# Patient Record
Sex: Female | Born: 1950 | Race: White | Hispanic: No | Marital: Married
Health system: Midwestern US, Community
[De-identification: ages and names within clinical notes are randomized; demographics above are authoritative.]

## PROBLEM LIST (undated history)

## (undated) DIAGNOSIS — R350 Frequency of micturition: Secondary | ICD-10-CM

## (undated) DIAGNOSIS — R9431 Abnormal electrocardiogram [ECG] [EKG]: Secondary | ICD-10-CM

## (undated) DIAGNOSIS — Z1239 Encounter for other screening for malignant neoplasm of breast: Secondary | ICD-10-CM

## (undated) DIAGNOSIS — N390 Urinary tract infection, site not specified: Secondary | ICD-10-CM

## (undated) DIAGNOSIS — Z96649 Presence of unspecified artificial hip joint: Secondary | ICD-10-CM

---

## 2009-07-20 ENCOUNTER — Encounter

## 2009-07-23 LAB — CULTURE, URINE

## 2009-07-23 NOTE — Telephone Encounter (Signed)
We only have a pending result as of today and awaiting suseptibility report

## 2009-07-23 NOTE — Telephone Encounter (Signed)
DROPPED OFF URINE SAMPLE ON MON 10/11 AND HAS NOT RECEIVED HER RESULTS YET AND/OR A RX// SHE STATES SHE HAS HAD THIS FOR 2 WKS AND IS IN MISERY// SHE STATES SHE DID NOT THINK WE DID DIP BECAUSE OF MEDS SHE IS ON// PLEASE CALL HER AT [phone]

## 2009-07-23 NOTE — Telephone Encounter (Signed)
Discussed with patient.

## 2009-07-23 NOTE — Telephone Encounter (Signed)
Call her in cipro500 mg po bid for 7 days

## 2009-07-30 NOTE — Telephone Encounter (Signed)
Message copied by Bartholomew Boards on Thu Jul 30, 2009 11:03 AM  ------       Message from: Kelly Splinter       Created: Mon Jul 27, 2009 11:03 AM         Did we rx and if so,   With what?

## 2009-07-30 NOTE — Telephone Encounter (Signed)
I called in macrobid.

## 2009-07-30 NOTE — Telephone Encounter (Signed)
She was treated with cipro feeling better but not gone

## 2009-08-17 NOTE — Progress Notes (Signed)
Subjective:      Patient ID: Rebecca Horn is a 58 y.o. female.    HPIHere for FU of her UTI. She has residual urinary incontinence and urgency. No dysuria noted,min frequency    Review of Systems   Constitutional: Negative for activity change, appetite change and fatigue.   Genitourinary: Positive for urgency. Negative for dysuria, frequency, hematuria and enuresis.   Musculoskeletal: Positive for arthralgias. Negative for myalgias, joint swelling and gait problem.       Objective:   Physical Exam   Nursing note and vitals reviewed.  Constitutional: She is oriented to person, place, and time. She appears well-developed and well-nourished. No distress.   HENT:   Head: Normocephalic and atraumatic.   Cardiovascular: Normal rate, regular rhythm and normal heart sounds.    Pulmonary/Chest: Effort normal and breath sounds normal.   Abdominal: Soft. Bowel sounds are normal. She exhibits no distension. No tenderness. She has no rebound.   Neurological: She is alert and oriented to person, place, and time.   Skin: She is not diaphoretic.       Assessment:      OA-Xrays ordered, mobic 15mg  po qd  Urinary incontinence-send culture and await. INB and cx neg-trial toviaz      Plan:      As above

## 2009-08-18 LAB — BASIC METABOLIC PANEL
BUN/Creatinine Ratio: 30 (calc) — ABNORMAL HIGH (ref 6–22)
BUN: 17 mg/dL (ref 7–25)
CO2: 27 mmol/L (ref 21–33)
Calcium: 8.9 mg/dL (ref 8.6–10.2)
Chloride: 102 mmol/L (ref 98–110)
Creatinine: 0.57 mg/dL — ABNORMAL LOW (ref 0.60–1.10)
Est, Glom Filt Rate: >60 mL/min/{1.73_m2} (ref >=60)
Glucose: 79 mg/dL (ref 65–99)
Potassium: 3.5 mmol/L (ref 3.5–5.3)
Sodium: 139 mmol/L (ref 135–146)
eGFR African American: >60 mL/min/{1.73_m2} (ref >=60)

## 2009-08-19 LAB — CULTURE, URINE

## 2009-10-26 NOTE — Telephone Encounter (Signed)
Ok verbally per Dr Benjaman Pott to refill

## 2010-03-12 NOTE — Telephone Encounter (Signed)
Pt states she has symptoms of uti// dr schroer on vacation and pt was asked if she wanted to give [place] urine sample for culture and dr Sherryll Burger could read results or she could wait and come in next week when kelly parsons (nurse practioner) here // pt stated she did not want to wait and would go to urgent care and have them send results to [place]

## 2011-02-01 NOTE — Telephone Encounter (Signed)
Bring her in Friday INB. Increase the meclizine to 1 po tid for 5 days then 1 po tid prn

## 2011-02-01 NOTE — Telephone Encounter (Signed)
Discussed with patient

## 2011-02-01 NOTE — Telephone Encounter (Signed)
dixxiness x 1wk went to urgent care was told was a viral and will go away but it  hasn't and is worse today they gave her 25mg  take 1/2 tab q 8hr

## 2011-12-26 NOTE — Telephone Encounter (Signed)
Ok per Dr Benjaman Pott

## 2011-12-27 NOTE — Telephone Encounter (Signed)
Over due for appointment

## 2011-12-27 NOTE — Telephone Encounter (Signed)
Ok per Dr Benjaman Pott

## 2012-01-24 NOTE — Telephone Encounter (Signed)
Ok verbally per Dr Benjaman Pott to refill

## 2012-02-01 NOTE — Progress Notes (Signed)
Subjective:      Patient ID: Rebecca Horn is a 61 y.o. female.    HPI She is here for FU of her HTN as well as knee pian bilaterally and her incontinence.     Review of Systems   Constitutional: Negative for activity change, appetite change, fatigue and unexpected weight change.   Genitourinary: Negative for dysuria, urgency, frequency, hematuria and difficulty urinating.        Incontinence. Leaks if she doesn't get directly to the BR. She wears a pad every day   Musculoskeletal: Positive for arthralgias and gait problem.       Objective:   Physical Exam   Vitals reviewed.  Constitutional: She appears well-developed and well-nourished. No distress.   HENT:   Head: Normocephalic and atraumatic.   Cardiovascular: Normal rate, regular rhythm and normal heart sounds.    Pulmonary/Chest: Effort normal and breath sounds normal. No respiratory distress. She has no wheezes. She exhibits no tenderness.   Musculoskeletal:        Right knee: She exhibits normal range of motion, no effusion, no erythema and no bony tenderness.        Left knee: She exhibits normal range of motion, no effusion, no laceration, no bony tenderness and normal meniscus.        Right ankle: She exhibits decreased range of motion and swelling. She exhibits no ecchymosis. No tenderness. No lateral malleolus and no medial malleolus tenderness found. Achilles tendon normal.   Skin: She is not diaphoretic.       Assessment:      1. Hypertension     2. Osteopenia  DEXA Bone Density Axial Skeleton   3. Family history of osteoporosis     4. Breast cancer screening  MAM Digital Screen Unilateral   5. Adverse effects of medication  Basic Metabolic Panel   6. Colon cancer screening  Ambulatory referral to Gastroenterology   7. Knee pain, bilateral  X-ray knee left AP and lateral, X-ray knee right AP and lateral   8. Left ankle pain  X-ray ankle left AP lateral and oblique   9. urinary incontinence-toviaz trial         Plan:      Orders Placed This  Encounter   Procedures   . DEXA Bone Density Axial Skeleton     Standing Status: Future      Number of Occurrences:       Standing Expiration Date: 01/31/2013   . MAM Digital Screen Unilateral   . X-ray knee left AP and lateral     Order Specific Question:  Reason for exam:     Answer:  knee pain   . X-ray knee right AP and lateral     Order Specific Question:  Reason for exam:     Answer:  knee pain   . X-ray ankle left AP lateral and oblique     Order Specific Question:  Reason for exam:     Answer:  left ankle pain   . Basic Metabolic Panel   . Ambulatory referral to Gastroenterology     Referral Priority:  Routine     Referral Type:  Consult for Advice and Opinion     Requested Specialty:  Gastroenterology     Number of Visits Requested:  1

## 2012-02-01 NOTE — Patient Instructions (Addendum)
Patient Self-Management Goal for Chronic Condition  Goal: I will schedule the recommended follow up visit when leaving the office today, and agree to keep the appointment or to reschedule when I call to cancel.  Barriers to success: none  Plan for overcoming my barriers: N/A     Confidence: 9/10  Date goal set: 02/01/2012  Date goal attained:     Knee Pain: After Your Visit  Your Care Instructions  Overuse is often the cause of knee pain. Other causes are climbing stairs, kneeling, or other activities that use the knee. Everyday wear and tear, especially as you get older, also can cause knee pain.  Rest, along with home treatment, often relieves pain and allows your knee to heal. If you have a serious knee injury, you may need tests and treatment.  Follow-up care is a key part of your treatment and safety. Be sure to make and go to all appointments, and call your doctor if you are having problems. It's also a good idea to know your test results and keep a list of the medicines you take.  How can you care for yourself at home?   Take pain medicines exactly as directed.   If the doctor gave you a prescription medicine for pain, take it as prescribed.   If you are not taking a prescription pain medicine, ask your doctor if you can take an over-the-counter medicine.   Do not take two or more pain medicines at the same time unless the doctor told you to. Many pain medicines contain acetaminophen, which is Tylenol. Too much acetaminophen (Tylenol) can be harmful.   Rest and protect your knee. Take a break from any activity that may cause pain.   Put ice or a cold pack on your knee for 10 to 20 minutes at a time. Put a thin cloth between the ice and your skin.   Prop up a sore knee on a pillow when you ice it or anytime you sit or lie down for the next 3 days. Try to keep it above the level of your heart. This will help reduce swelling.   If your doctor recommends an elastic bandage, sleeve, or other type of  support for your knee, wear it as directed.   If your knee is not swollen, you can put moist heat, a heating pad, or a warm cloth on your knee.   After several days of rest, you can begin gentle exercise of your knee.   Reach and stay at a healthy weight. Extra weight can strain the joints, especially the knees and hips, and make the pain worse. Losing even a few pounds may help.  When should you call for help?  Call 911 anytime you think you may need emergency care. For example, call if:   You have sudden chest pain and shortness of breath, or you cough up blood.  Call your doctor now or seek immediate medical care if:   You have severe or increasing pain.   Your leg or foot turns cold or changes color.   You cannot stand or put weight on your knee.   Your knee looks twisted or bent out of shape.   You cannot move your knee.   You have signs of infection, such as:   Increased pain, swelling, warmth, or redness.   Red streaks leading from the sore area.   Pus draining from a place on your knee.   A fever.   You have signs of  a blood clot in your leg, such as:   Pain in your calf, back of the knee, thigh, or groin.   Redness and swelling in your leg or groin.  Watch closely for changes in your health, and be sure to contact your doctor if:   Your knee feels numb or tingly.   You do not get better as expected.   You have any new symptoms, such as swelling.   You have bruises from a knee injury that last longer than 2 weeks.    Where can you learn more?    Go to [email or web address] and sign in to your MyChart account.    Enter K195 in the Search Health Information box to learn more about "Knee Pain: After Your Visit."    If you do not have an account, please click on the "Sign Up Now" link.     2006-2012 [employer]. Care instructions adapted under license by [facility]. This care instruction is for use with your licensed healthcare professional. If  you have questions about a medical condition or this instruction, always ask your healthcare professional. [employer], Inc[employer] disclaims any warranty or liability for your use of this information.  Content Version: 9.4.94723; Last Revised: November 13, 2009              Rebecca Horn received counseling on the following healthy behaviors: exercise    Patient given educational materials on Nutrition and Exercise    I have instructed Rebecca Horn to complete a self tracking handout on Weights and instructed them to bring it with them to her next appointment.     Discussed use, benefit, and side effects of prescribed medications.  Barriers to medication compliance addressed.  All patient questions answered.  Pt voiced understanding.

## 2012-02-02 LAB — TSH: TSH: 1.41 u[IU]/mL (ref 0.35–5.5)

## 2012-02-02 LAB — BASIC METABOLIC PANEL
BUN: 20 mg/dl — ABNORMAL HIGH (ref 7–18)
CO2: 26 mEq/L (ref 21–32)
Calcium: 9.3 mg/dl (ref 8.3–10.6)
Chloride: 102 mEq/L (ref 99–110)
Creatinine: 0.6 mg/dl (ref 0.6–1.1)
GFR Est, African/Amer: >60
GFR, Estimated: >60 (ref >60)
Glucose: 80 mg/dl (ref 70–99)
Potassium: 4 mEq/L (ref 3.5–5.1)
Sodium: 140 mEq/L (ref 136–145)

## 2012-02-02 LAB — VITAMIN B12: Vitamin B-12: 293 pg/ml (ref 211–911)
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
## 2012-03-28 NOTE — Patient Instructions (Signed)
Toenail Fungus: After Your Visit  Your Care Instructions  A toenail that is infected by a fungus usually turns white or yellow. As the fungus spreads, the nail turns a darker color and gets thicker, and its edges start to turn ragged and crumble. A bad infection can cause toe pain, and the nail may pull away from the toe.  Toenails that are exposed to moisture and warmth a lot are more likely to get infected by a fungus. This can happen from wearing sweaty shoes often and from walking barefoot on shower floors.  It is hard to treat toenail fungus, and the infection can return after it has cleared up. But medicines can sometimes get rid of toenail fungus for good. If the infection is very bad, or if it causes a lot of pain, you may need to have the nail removed.  Follow-up care is a key part of your treatment and safety. Be sure to make and go to all appointments, and call your doctor if you are having problems. It???s also a good idea to know your test results and keep a list of the medicines you take.  How can you care for yourself at home?  ?? Take your medicines exactly as prescribed. Call your doctor if you have any problems with your medicine. You will get more details on the specific medicines your doctor prescribes.  ?? If your doctor gave you a cream or liquid to put on your toenail, use it exactly as directed.  ?? Wash your feet often, and wash your hands after touching your feet.  ?? Keep your toenails clean and dry. Dry your feet completely after you bathe and before you put on shoes and socks.  ?? Keep your toenails trimmed.  ?? Change socks often. Wear dry socks that absorb moisture.  ?? Do not go barefoot in public places.  ?? Use a spray or powder that fights fungus on your feet and in your shoes.  ?? Do not pick at the skin around your nails.  ?? Do not use nail polish or fake nails on your toenails.  When should you call for help?  Call your doctor now or seek immediate medical care if:  ?? You have signs of  infection, such as:  ?? Increased pain, swelling, warmth, or redness.  ?? Red streaks leading from the site.  ?? Pus draining from the site.  ?? A fever.  ?? You have new or increased toe pain.  Watch closely for changes in your health, and be sure to contact your doctor if:  ?? You do not get better as expected.    Where can you learn more?    Go to [email or web address] and sign in to your MyChart account. Enter D202 in the Search Health Information box to learn more about ???Toenail Fungus: After Your Visit.???    If you do not have an account, please click on the ???Sign Up Now??? link.      ?? 2006-2013 [employer], [employer]. Care instructions adapted under license by [facility]. This care instruction is for use with your licensed healthcare professional. If you have questions about a medical condition or this instruction, always ask your healthcare professional. [employer] disclaims any warranty or liability for your use of this information.  Content Version: 9.7.130178; Last Revised: August 09, 2010

## 2012-03-29 LAB — HEPATIC FUNCTION PANEL
ALT: 16 U/L (ref 10–40)
ALT: 16 U/L (ref 10–40)
AST: 19 U/L (ref 15–37)
AST: 19 U/L (ref 15–37)
Albumin: 3.9 g/dL (ref 3.4–5.0)
Albumin: 3.9 g/dL (ref 3.4–5.0)
Alkaline Phosphatase: 54 U/L (ref 45–129)
Alkaline Phosphatase: 54 U/L (ref 45–129)
Bilirubin, Direct: 0.1 mg/dL (ref 0.0–0.3)
Bilirubin, Direct: 0.1 mg/dL (ref 0.0–0.3)
Bilirubin, Indirect: 0.3 mg/dL (ref 0.0–1.0)
I Bili-NB: 0.3 mg/dL (ref 0.0–1.0)
Total Bilirubin: 0.4 mg/dL (ref 0.0–1.0)
Total Bilirubin: 0.4 mg/dL (ref 0.0–1.0)
Total Protein: 6.1 g/dL — ABNORMAL LOW (ref 6.4–8.2)
Total Protein: 6.1 g/dL — ABNORMAL LOW (ref 6.4–8.2)

## 2012-04-24 MED ORDER — MICROZIDE 12.5 MG PO CAPS
12.5 MG | ORAL_CAPSULE | ORAL | Status: DC
Start: 2012-04-24 — End: 2012-06-23

## 2012-04-24 NOTE — Telephone Encounter (Signed)
Ok verbally per Dr Schroer to refill

## 2012-05-17 NOTE — Op Note (Unsigned)
PATIENT NAME:                 PA #:            MR #Rebecca Horn, Rebecca Horn              [id number]       [id number]            SURGEON:                              SURG DATE:  DIS DATE:          Augustina Mood, DO              05/17/2012                     DATE OF BIRTH:   AGE:           PATIENT TYPE:     RM #:              1951-03-16       60             OSQ                                     PROCEDURE:  Esophagogastroduodenoscopy with biopsy, esophageal balloon  dilatation, and colonoscopy with biopsy and polypectomy.     INDICATIONS:  This is a 61 year old Caucasian female presenting for initial  screening colonoscopy.  She is at average risk for developing colon cancer  with no family history of colon cancer or colon polyps.  She also reports  some dysphagia for solids and history of heartburn.     CONSENT:  The patient has signed a consent and is aware of the indications,  complications and alternatives to the procedure.  The patient was also  examined by myself before the procedure and was found to be fit for conscious  sedation.  The patient is aware of the sedation to be used.     MONITORING:  The patient was monitored with continuous pulse oximetry and  intermittent blood pressure monitoring throughout the procedure.  The patient  was hemodynamically stable throughout the procedure.  The patient was  comfortable throughout the procedure.     PREMEDICATIONS:  Demerol 125 mg, Versed 10 mg were given over a slow IV in  incremental boluses prior to and during the procedure.       POSTOPERATIVE DIAGNOSES:  1.  Mild [place] class A reflux esophagitis.  2.  A benign mild Schatzki ring noted at the gastroesophageal junction,  status post empiric dilation with a 20 mm controlled-release expansion  balloon.  3.  Small sliding hiatal hernia.  4.  Mild gastritis.  Biopsies were obtained with cold forceps for histology  to evaluate for the presence of Helicobacter pylori.  5.  Normal appearing  duodenal bulb and second portion of the duodenum.  6.  A 4 mm sessile ascending colon polyp removed with biopsy polypectomy.  7.  Mild left-sided diverticular disease.  8.  Two sessile rectal polyps measuring 2 mm respectively, removed with  biopsy polypectomy x2.  9.  Small internal hemorrhoids.     PROCEDURE DETAILS:  After adequate premedication, the patient was  placed in  the left lateral decubitus position.  The Olympus endoscope was introduced  through the oropharynx to the extent of the second portion of the duodenum.   The procedure was accomplished without difficulty and the patient tolerated  the procedure well.  Mucosal views were adequate.  The cervical, mid and  distal esophagus was grossly examined.  There was mild [place] class A reflux  esophagitis.  No columnar epithelium was visualized.  There was also a  Schatzki ring noted.  This was mild in severity.  The scope was easily  traversed through the gastroesophageal junction into the stomach.   Retroflexed views of the stomach did reveal a small sliding hiatal hernia.   The body and greater curvature of the stomach had evidence of retained  bilious liquid.  There was mild gastritis noted in the stomach.  Biopsies  were obtained with cold forceps from the body and antrum of the stomach.  No  ulcers were seen.  No retained food was noted.  The pylorus was easily  traversed into the duodenal bulb.  The duodenal bulb and second portion of  the duodenum were normal.  The instrument was then withdrawn back to the  gastroesophageal junction.  An empiric 20 mm balloon dilatation was  undertaken at the level of the gastroesophageal junction.  The balloon was  left inflated for 30 seconds.  The balloon was deflated.  Postdilatation  appearance satisfactory, without any evidence of excessive bleeding or any  evidence of mucosal tearing.  The instrument was then withdrawn and the  procedure was completed.       The patient was then repositioned and the Olympus  pediatric colonoscope was  introduced atraumatically through the anus to the extent of the cecum.  The  procedure was accomplished without difficulty and the patient tolerated the  procedure well.  Mucosal views were adequate.  The colon preparation was  good.  The appendiceal orifice and ileocecal valve were identified and  appeared grossly normal.  The scope was then slowly withdrawn with close  mucosal inspection.  The vascular pattern throughout the entire colon was  within normal limits.  There was a 4 mm proximal ascending colon polyp  identified.  This was removed with biopsy polypectomy.  The postpolypectomy  appearance was satisfactory, without any evidence of remnant tissue or  excessive bleeding.  There was some mild diverticular disease noted in the  left colon.  In the distal rectum just proximal to the dentate line there  were 2 sessile polyps measuring 2 mm, respectively.  These were removed with  biopsy polypectomy x2.  The postpolypectomy appearance was satisfactory  without any evidence of remnant tissue or excessive bleeding.  There were  small internal hemorrhoids noted on retroflex view of the rectum.  The  instrument was then withdrawn and the procedure was completed.     PLAN:  1.  We will start the patient on omeprazole 20 mg by mouth once daily.  2.  Antireflux precautions will be stressed to the patient.  3.  We will monitor the patients response to dilatation.  4.  Would follow up on the biopsies of the colon polyps removed today.  If  all 3 polyps reveal adenomatous tissue would recommend a repeat colonoscopy  in 3 years.  If only 2 or less polyps reveal adenomatous tissue would  recommend a repeat colonoscopy in 5 years.  If no polyps reveal adenomatous  tissue can repeat colonoscopy in 10 years.  5.  Would recommend a high-fiber diet to the patient.    6.  The patient will be instructed to call our office with any severe  postpolypectomy bleeding or postprocedural pain.                                             Durene Romans Bennett, [place]     [id number]  DD: 05/17/2012 08:51   DT: 05/17/2012 09:20   Job #: [id number]  CC: Mendel Ryder, DO  CC: Kelly Splinter, MD

## 2012-06-06 NOTE — Progress Notes (Signed)
Patient understands medication purpose, goal and dosage.

## 2012-06-06 NOTE — Patient Instructions (Addendum)
Patient Self-Management Goal for Chronic Condition  Goal: I will take all medications as prescribed by my doctor, and I will call the office if I am having any medication problems.  Barriers to success: none  Plan for overcoming my barriers: N/A     Confidence: 8/10  Date goal set: 06/06/2012  Date goal attained:   Menopause Diet: After Your Visit  Your Care Instructions  Healthy eating helps ease menopause symptoms. And it can reduce your risk for getting conditions such as osteoporosis and heart disease.  Follow-up care is a key part of your treatment and safety. Be sure to make and go to all appointments, and call your doctor if you are having problems. It's also a good idea to know your test results and keep a list of the medicines you take.  How can you care for yourself at home?  ?? Limit fats in your diet.  ?? Choose foods that have a lot of calcium. The recommended daily intake for adults ages 35 to 23 is 1,000 milligrams (mg). Adults over 50 need 1,200 mg a day. Take a calcium supplement if you don't get enough calcium in the foods you eat.  ?? Add vitamin D to your daily diet. It helps your body use calcium. The recommended daily intake of vitamin D is 600 international units (IU) a day for children and adults up to age 64. Adults age 68 and older need 800 IU a day. Take vitamin D supplements if you need to.  ?? Include good sources of fiber in your diet each day. These include whole grains, beans, fruits, and vegetables.  ?? Avoid simple sugars. This helps if you have mood swings, anxiety, or depression.  ?? Avoid caffeine, or cut back on it. Caffeine can cause sleep problems. It can also make you feel anxious. To relieve these symptoms, pay attention to how much caffeine you are getting in drinks and chocolate.  ?? Limit your intake of alcohol. Heavy drinking tends to make symptoms worse.    Where can you learn more?    Go to [email or web address] and sign in to your MyChart account. Enter  [phone] in the Search Health Information box to learn more about ???Menopause Diet: After Your Visit.???    If you do not have an account, please click on the ???Sign Up Now??? link.      ?? 2006-2013 [employer]. Care instructions adapted under license by [facility]. This care instruction is for use with your licensed healthcare professional. If you have questions about a medical condition or this instruction, always ask your healthcare professional. [employer], Inc[employer] disclaims any warranty or liability for your use of this information.  Content Version: 9.7.130178; Last Revised: November 07, 2011

## 2012-06-07 LAB — COMPREHENSIVE METABOLIC PANEL
ALT: 19 U/L (ref 10–40)
AST: 15 U/L (ref 15–37)
Albumin/Globulin Ratio: 1.9 (ref 1.1–2.2)
Albumin: 4.1 g/dL (ref 3.4–5.0)
Alkaline Phosphatase: 56 U/L (ref 45–129)
BUN: 21 mg/dL — ABNORMAL HIGH (ref 7–18)
CO2: 27 mEq/L (ref 21–32)
Calcium: 9.4 mg/dL (ref 8.3–10.6)
Chloride: 103 mEq/L (ref 99–110)
Creatinine: 0.6 mg/dL (ref 0.6–1.2)
GFR African American: >60 (ref >60)
GFR Non-African American: >60 (ref >60)
Globulin: 2 g/dL
Glucose: 81 mg/dL (ref 70–99)
Potassium: 4 mEq/L (ref 3.5–5.1)
Sodium: 138 mEq/L (ref 136–145)
Total Bilirubin: 0.4 mg/dL (ref 0.00–1.00)
Total Protein: 6.3 g/dL — ABNORMAL LOW (ref 6.4–8.2)

## 2012-06-25 MED ORDER — MICROZIDE 12.5 MG PO CAPS
12.5 MG | ORAL_CAPSULE | ORAL | Status: DC
Start: 2012-06-25 — End: 2012-09-16

## 2012-06-25 NOTE — Telephone Encounter (Signed)
Ok verbally per Dr Schroer to refill

## 2012-06-27 MED ORDER — LAMISIL 250 MG PO TABS
250 MG | ORAL_TABLET | ORAL | Status: DC
Start: 2012-06-27 — End: 2012-08-22

## 2012-06-27 NOTE — Telephone Encounter (Signed)
Ok verbally per Dr Schroer to refill

## 2012-08-10 NOTE — Telephone Encounter (Signed)
Pt scheduled appt for next Friday w/ kelly

## 2012-08-17 MED ORDER — PREDNISONE 5 MG PO TABS
5 MG | ORAL_TABLET | Freq: Every day | ORAL | Status: AC
Start: 2012-08-17 — End: 2012-08-25

## 2012-08-17 MED ORDER — HYDROCODONE-ACETAMINOPHEN 5-325 MG PO TABS
5-325 MG | ORAL_TABLET | Freq: Three times a day (TID) | ORAL | Status: AC | PRN
Start: 2012-08-17 — End: 2012-09-16

## 2012-08-17 NOTE — Progress Notes (Signed)
Subjective:      Patient ID: Rebecca Horn is a 61 y.o. female.    HPI  Patient is here with a complaint of right hip pain that radiates into her right anterior thigh to the foot for 3-4 weeks.  The pain is most notable first thing in the morning and when she is ambulatory.  She has tingling in the right medial ankle, but this is not new. She remembers no injury or trauma.  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 Anselm Jungling and Dr. Aleene Davidson), but is trying to put it off.  She is compensating for the pain in the left knee.  She has been using a cane.  Of note is that she also has a history of osteoporosis.  She is currently on a drug holiday from bisphosphonates.  Dr. Benjaman Pott is planning to start her on Forteo.    Review of Systems   Constitutional: Positive for activity change.        Morbid obesity   Respiratory: Negative.    Cardiovascular: Negative.         History of hypertension.   Gastrointestinal: Negative.    Endocrine:        Diabetes; osteoporosis   Musculoskeletal: Positive for arthralgias and gait problem.        See HPI   Neurological: Positive for numbness.        Right ankle numbness unchanged   Hematological: Negative.    Psychiatric/Behavioral: Negative.        Objective:   Physical Exam   Nursing note and vitals reviewed.  Constitutional: She is oriented to person, place, and time. She appears well-developed and well-nourished. No distress.   HENT:   Head: Normocephalic and atraumatic.   Eyes: Conjunctivae are normal. No scleral icterus.   Cardiovascular: Normal rate, regular rhythm and normal heart sounds.    Pulmonary/Chest: Effort normal and breath sounds normal. No respiratory distress. She has no wheezes. She has no rales.   Musculoskeletal: She exhibits tenderness.   Sensation is intact.  SLR is negative.  Patrick's test is positive for pain in the groin region.  There is tenderness in the groin.  Muscle strength is slightly  decreased at the right hip.  DTR's are intact and equal.   Neurological: She is alert and oriented to person, place, and time.   Skin: Skin is warm and dry. She is not diaphoretic.   Psychiatric: She has a normal mood and affect. Her behavior is normal.       Assessment:      1. Hip pain  X-ray hip right AP and lateral   2. Osteoporosis, postmenopausal       I am treating with a tapering course of prednisone.  Norco for pain.  Will refer to either Dr. Berneda Rose or Vassie Loll (for knee replacement) and have them evaluate hip if no improvement; although I think this is functional due to compensation from knee pain.  Will get hip xray.      Dr. Benjaman Pott will discuss Forteo on return office visit.      Plan:      Aryka was seen today for leg pain.    Diagnoses and associated orders for this visit:    Hip pain  - X-ray hip right AP and lateral    Osteoporosis, postmenopausal    Other Orders  - predniSONE (DELTASONE) 5 MG tablet; Take 1  tablet by mouth daily for 8 days. 8 tablets on day 1; 7 tablets on day 2; 6 tablets on day 3; 5 tablets on day 4  - HYDROcodone-acetaminophen (NORCO) 5-325 MG per tablet; Take 1 tablet by mouth every 8 hours as needed for Pain for 30 days.

## 2012-08-17 NOTE — Patient Instructions (Signed)
Patient Self-Management Goal for Chronic Condition  Goal: I will take all medications as prescribed by my doctor, and I will call the office if I am having any medication problems.  Barriers to success: none  Plan for overcoming my barriers: N/A     Confidence: 8/10  Date goal set: 08/17/2012  Date goal attained:

## 2012-08-20 NOTE — Telephone Encounter (Signed)
Pt called this pm and was informed her hip x-ray was negative for fracture, but she states she doesn't feel the steroids or the norco are helping at all// I know she just started on them Friday, but she is asking for any other suggestions for what the pain is? ---pt states she will call our office back at 4:00 to check response

## 2012-08-21 NOTE — Telephone Encounter (Signed)
I discussed this with Dr. Benjaman Pott on Friday.  I can send her to an orthopedist.  Did Jodie ever get in touch with Dr. Scarlette Calico office to see if he is doing hips?  I asked her to check with him on Friday, and if she couldn't get him on Friday, to call his office on Monday and ask.  That was Dr. Larey Brick first choice.  If not Dr. Berneda Rose, then Dr. Vassie Loll.  Thank you.

## 2012-08-21 NOTE — Telephone Encounter (Signed)
Spoke with Jodie.  The patient will be seeing Dr. Berneda Rose tomorrow.  Jodie will inform the patient of this when she calls in at 4 pm because the patient does not have a working number and can only call in to the office.

## 2012-08-21 NOTE — Telephone Encounter (Signed)
Tresa Endo called and discussed this with jodie this am

## 2012-08-21 NOTE — Telephone Encounter (Signed)
Please call me with the status of this.  Thank you.

## 2012-08-21 NOTE — Telephone Encounter (Signed)
Discussed with patient

## 2012-08-22 MED ORDER — TERBINAFINE HCL 250 MG PO TABS
250 MG | ORAL_TABLET | ORAL | Status: DC
Start: 2012-08-22 — End: 2012-09-16

## 2012-08-22 NOTE — Progress Notes (Signed)
JULEANA COOKE  [id number]  August 22, 2012    Chief Complaint   Patient presents with   ??? Hip Pain     right         History:   Ms. ZAREYA PARRIS is a  61 y.o. female who is here for evaluation of right hip symptoms. She states she has had right hip pain over several months which has increased over the last month. There was no history of injury. The pain is located in the lateral side and in the groin. The pain is described as moderate. She is on a Prednisone taper which is not helping. She has a long standing history of left knee pain with progressive deformity. She is on Norco for pain.    The patient's  past medical history, medications, allergies,  family history, social history, and review of systems have been reviewed, and dated and are recorded in the chart.      BP 146/78   Pulse 68   Ht 5\' 2"  (1.575 m)   Wt 230 lb (104.327 kg)   BMI 42.06 kg/m2    Physical: Ms. TANZA VRANICH appears well, she is in no apparent distress, she demonstrates appropriate mood & affect. She is alert and oriented to person, place and time. She has severe pain with internal rotation of the right hip. Range of motion of the right hip is : 30 degrees abduction, 30 degrees adduction, 35 degrees of external rotation and 10 degrees of internal rotation. Range of motion of the opposite hip is full. She is non tender laterally about the hips. Trendelenburg test is negative bilaterally. Pearlean Brownie test is negative bilaterally. She is non tender about the Sacroiliac joint bilaterally. Leg length discrepancy: none.   Examination of the skin reveals no rashes, ulcerations, or lesions, bilaterally in the lower extremities.  Sensation to both lower extremities is grossly intact.  Exam of both feet reveals pedal pulses intact and brisk cap refill.  Patient is able to dorsiflex and wiggle all toes.  Deep tendon reflexes of the lower extremities are normal and symmetric. She is non tender to palpation of the lumbar spine. She has a severe  valgus deformity of the left knee.    X-rays: 2 views of the right hip were obtained and demonstrate severe osteoarthritis.    Impression: right Hip Osteoarthritis    Plan: At this time, the patient will be scheduled for a right total hip arthroplasty. All risks including but not limited to blood loss, infection, persistent pain,stiffness, weakness,loosening,deep vein thrombosis,neurovascular injury, and the risks of anesthesia were discussed. The patient understands all risks and benefits of the procedure and agrees to proceed. The patient will see her primary care physician,Melissa Koleen Distance, MD, for medical clearance.  If the patient is on NSAIDS or blood thinners these medications will need to be discontinued one week prior to surgery. The patient will ultimately require left total knee as well.

## 2012-08-22 NOTE — Telephone Encounter (Signed)
Ok verbally per Dr Schroer to refill

## 2012-09-05 LAB — CBC WITH AUTO DIFFERENTIAL
Basophils %: 0.7 %
Basophils Absolute: 0.1 10*3/uL (ref 0.0–0.2)
Eosinophils %: 2.4 %
Eosinophils Absolute: 0.2 10*3/uL (ref 0.0–0.6)
Hematocrit: 40.5 % (ref 36.0–48.0)
Hemoglobin: 13.5 g/dL (ref 12.0–16.0)
Lymphocytes %: 22.6 %
Lymphocytes Absolute: 1.7 10*3/uL (ref 1.0–5.1)
MCH: 32 pg (ref 26.0–34.0)
MCHC: 33.4 g/dL (ref 31.0–36.0)
MCV: 95.8 fL (ref 80.0–100.0)
MPV: 9.4 fL (ref 5.0–10.5)
Monocytes %: 8.6 %
Monocytes Absolute: 0.7 10*3/uL (ref 0.0–1.3)
Neutrophils %: 65.7 %
Neutrophils Absolute: 5 10*3/uL (ref 1.7–7.7)
Platelets: 261 10*3/uL (ref 135–450)
RBC: 4.23 M/uL (ref 4.00–5.20)
RDW: 14.4 % (ref 12.4–15.4)
WBC: 7.7 10*3/uL (ref 4.0–11.0)

## 2012-09-17 MED ORDER — HYDROCHLOROTHIAZIDE 12.5 MG PO CAPS
12.5 MG | ORAL_CAPSULE | ORAL | Status: DC
Start: 2012-09-17 — End: 2012-10-18

## 2012-09-17 MED ORDER — TERBINAFINE HCL 250 MG PO TABS
250 MG | ORAL_TABLET | ORAL | Status: DC
Start: 2012-09-17 — End: 2013-01-08

## 2012-09-17 NOTE — Telephone Encounter (Signed)
Ok verbally per Dr Schroer to refill

## 2012-10-07 NOTE — Progress Notes (Signed)
Subjective:      Patient here for follow-up of elevated blood pressure.  She is not exercising and is not adherent to a low-salt diet.  Blood pressure is well controlled at home. Cardiac symptoms: none. Patient denies: chest pain, chest pressure/discomfort, exertional chest pressure/discomfort and palpitations. Cardiovascular risk factors: none. Use of agents associated with hypertension: none. History of target organ damage: none.She also needs FU on her thickened nails.    Patient's medications, allergies, past medical, surgical, social and family histories were reviewed and updated as appropriate.    Review of Systems  Pertinent items are noted in HPI.        Objective:      BP 126/84   Pulse 67   Ht 5\' 2"  (1.575 m)   Wt 236 lb (107.049 kg)   BMI 43.15 kg/m2   SpO2 97%  General appearance: alert, appears stated age, cooperative and no distress  Neck: no adenopathy, supple, symmetrical, trachea midline and thyroid not enlarged, symmetric, no tenderness/mass/nodules  Lungs: clear to auscultation bilaterally  Heart: regular rate and rhythm, S1, S2 normal, no murmur, click, rub or gallop  Extremities: extremities normal, atraumatic, no cyanosis or edema and toenails are thickened and yellow. Especially affected are her great toes bialterally  Pulses: 2+ and symmetric        Assessment:      Hypertension, normal blood pressure . Evidence of target organ damage: none.      1. Hypertension     2. Onychomycosis of toenail     3. Medication monitoring encounter  Comprehensive metabolic panel   4. Need for diphtheria-tetanus-pertussis (Tdap) vaccine, adult/adolescent  Tdap vaccine greater than or equal to 7yo IM       Plan:      Medication: as per orders.   Orders Placed This Encounter   Procedures   ??? Tdap vaccine greater than or equal to 7yo IM   ??? Comprehensive metabolic panel

## 2012-10-09 LAB — COMPREHENSIVE METABOLIC PANEL
ALT: 18 U/L (ref 10–40)
AST: 19 U/L (ref 15–37)
Albumin/Globulin Ratio: 2 (ref 1.1–2.2)
Albumin: 4.1 g/dL (ref 3.4–5.0)
Alkaline Phosphatase: 67 U/L (ref 40–129)
BUN: 20 mg/dL (ref 7–20)
CO2: 27 mmol/L (ref 21–32)
Calcium: 8.7 mg/dL (ref 8.3–10.6)
Chloride: 100 mmol/L (ref 99–110)
Creatinine: 0.6 mg/dL (ref 0.6–1.2)
GFR African American: >60 (ref >60)
GFR Non-African American: >60 (ref >60)
Globulin: 2.1 g/dL
Glucose: 83 mg/dL (ref 70–99)
Potassium: 3.8 mmol/L (ref 3.5–5.1)
Sodium: 141 mmol/L (ref 136–145)
Total Bilirubin: 0.2 mg/dL (ref 0.0–1.0)
Total Protein: 6.2 g/dL — ABNORMAL LOW (ref 6.4–8.2)

## 2012-10-09 LAB — CBC
Hematocrit: 40.4 % (ref 36.0–48.0)
Hemoglobin: 13.6 g/dL (ref 12.0–16.0)
MCH: 31.8 pg (ref 26.0–34.0)
MCHC: 33.7 g/dL (ref 31.0–36.0)
MCV: 94.3 fL (ref 80.0–100.0)
MPV: 9.1 fL (ref 5.0–10.5)
Platelets: 229 10*3/uL (ref 135–450)
RBC: 4.28 M/uL (ref 4.00–5.20)
RDW: 14.4 % (ref 12.4–15.4)
WBC: 7.4 10*3/uL (ref 4.0–11.0)

## 2012-10-09 LAB — PROTIME-INR
INR: 0.91 (ref 0.85–1.15)
Protime: 10.2 s (ref 10.0–12.8)

## 2012-10-09 LAB — APTT: aPTT: 31.4 s (ref 23.1–35.5)

## 2012-10-09 LAB — SEDIMENTATION RATE: Sed Rate: 17 mm/Hr (ref 0–30)

## 2012-10-09 MED ORDER — METOPROLOL SUCCINATE ER 25 MG PO TB24
25 MG | ORAL_TABLET | Freq: Every day | ORAL | Status: DC
Start: 2012-10-09 — End: 2012-10-09

## 2012-10-09 MED ORDER — METOPROLOL SUCCINATE ER 25 MG PO TB24
25 MG | ORAL_TABLET | Freq: Every day | ORAL | Status: DC
Start: 2012-10-09 — End: 2013-01-08

## 2012-10-09 NOTE — Patient Instructions (Signed)
Patient Self-Management Goal for Chronic Condition  Goal: I will take all medications as prescribed by my doctor, and I will call the office if I am having any medication problems.  Barriers to success: none  Plan for overcoming my barriers: N/A     Confidence: 8/10  Date goal set: 10/09/2012  Date goal attained:

## 2012-10-09 NOTE — Progress Notes (Addendum)
Subjective:      Patient ID: Rebecca Horn is a 61 y.o. female.    HPI In for preoperative exam for right hip surgery (oatal hip replacement).  Scheduled on 10/16/12 by Dr. Berneda Rose at [facility].  Has a c/o of shooting pain from right hip to knee 1-2 times a day.  Takes Tylenol for pain management.  Is using a cane.  Gait is steady with cane.  Has a history of sleep apnea uses a Bipap at home at night.  Is on Toviaz 8 mg daily but still reports urgency frequency.  Has c/o back pain in the lower back has issues with a 4-5 vertebrate.  Has a 10 pound weight lifting limit.  Has difficulty with standing after sitting for long periods due to stiffnes for arthritis.  Has joint swelling and some limited ROM to the right hip.  Hip is tender to the touch.      Review of Systems   Constitutional: Negative.    HENT: Negative.    Eyes: Negative.    Respiratory: Positive for apnea. Negative for cough, shortness of breath and wheezing.    Cardiovascular: Negative.    Gastrointestinal: Negative.    Endocrine: Negative.    Genitourinary: Positive for urgency and frequency. Negative for dysuria, hematuria, flank pain, decreased urine volume, vaginal bleeding, vaginal discharge, enuresis, difficulty urinating, vaginal pain, pelvic pain and dyspareunia.   Musculoskeletal: Positive for back pain, joint swelling, arthralgias and gait problem. Negative for myalgias.   Skin: Negative.    Allergic/Immunologic: Negative.    Neurological: Negative.    Psychiatric/Behavioral: Negative.        Objective:   Physical Exam   Vitals reviewed.  Constitutional: She is oriented to person, place, and time. She appears well-developed and well-nourished. No distress.   HENT:   Head: Normocephalic and atraumatic.   Right Ear: External ear normal.   Left Ear: External ear normal.   Mouth/Throat: Oropharynx is clear and moist. No oropharyngeal exudate.   Eyes: Conjunctivae are normal. Pupils are equal, round, and reactive to light. Right eye  exhibits no discharge. Left eye exhibits no discharge. No scleral icterus.   Neck: Normal range of motion. Neck supple. No tracheal deviation present. No thyromegaly present.   Cardiovascular: Normal rate, normal heart sounds and intact distal pulses.  Exam reveals no gallop.    No murmur heard.  Pulmonary/Chest: Effort normal and breath sounds normal. No respiratory distress. She has no wheezes.   Abdominal: Soft. Bowel sounds are normal. She exhibits no distension and no mass.   Musculoskeletal: She exhibits tenderness. She exhibits no edema.   Neurological: She is alert and oriented to person, place, and time.   Skin: Skin is warm and dry. She is not diaphoretic.   Psychiatric: She has a normal mood and affect. Her behavior is normal. Thought content normal.       Assessment:      Preoperative examination       Plan:      Proceed with surgery as scheduled.          Labs and EKG reviewed.

## 2012-10-11 LAB — MICROSCOPIC URINALYSIS: RBC, UA: NONE SEEN /HPF (ref 0–2)

## 2012-10-11 LAB — APTT: aPTT: 30 s (ref 23.1–35.5)

## 2012-10-11 LAB — URINALYSIS WITH REFLEX TO CULTURE
Bilirubin Urine: NEGATIVE mg/dL
Blood, Urine: NEGATIVE
Glucose, Ur: NEGATIVE mg/dL
Ketones, Urine: NEGATIVE mg/dL
Nitrite, Urine: POSITIVE — AB
Protein, UA: NEGATIVE mg/dL
Specific Gravity, UA: 1.01 (ref 1.005–1.030)
Urobilinogen, Urine: 0.2 E.U./dL (ref <2.0)
pH, UA: 7 (ref 5.0–8.0)

## 2012-10-11 LAB — CBC
Hematocrit: 40.3 % (ref 36.0–48.0)
Hemoglobin: 13.5 g/dL (ref 12.0–16.0)
MCH: 31.5 pg (ref 26.0–34.0)
MCHC: 33.5 g/dL (ref 31.0–36.0)
MCV: 93.9 fL (ref 80.0–100.0)
MPV: 9 fL (ref 5.0–10.5)
Platelets: 223 10*3/uL (ref 135–450)
RBC: 4.29 M/uL (ref 4.00–5.20)
RDW: 14.4 % (ref 12.4–15.4)
WBC: 7.7 10*3/uL (ref 4.0–11.0)

## 2012-10-11 LAB — BASIC METABOLIC PANEL
BUN: 18 mg/dL (ref 7–20)
CO2: 30 mmol/L (ref 21–32)
Calcium: 9.1 mg/dL (ref 8.3–10.6)
Chloride: 100 mmol/L (ref 99–110)
Creatinine: 0.5 mg/dL — ABNORMAL LOW (ref 0.6–1.2)
GFR African American: >60 (ref >60)
GFR Non-African American: >60 (ref >60)
Glucose: 88 mg/dL (ref 70–99)
Potassium: 3.8 mmol/L (ref 3.5–5.1)
Sodium: 140 mmol/L (ref 136–145)

## 2012-10-11 LAB — TYPE AND SCREEN
ABO/Rh: O POS
Antibody Screen: NEGATIVE

## 2012-10-11 LAB — PROTIME-INR
INR: 0.88 (ref 0.85–1.15)
Protime: 9.9 s — ABNORMAL LOW (ref 10.0–12.8)

## 2012-10-11 LAB — ALBUMIN: Albumin: 4.3 g/dL (ref 3.4–5.0)

## 2012-10-11 NOTE — Patient Instructions (Signed)
PRE-OP INSTRUCTIONS     ?? Do not eat or drink anything after 12:00 midnight prior to surgery.  This includes water, chewing gum, mints and ice chips.  You may brush your teeth and gargle the morning of surgery but DO  NOT SWALLOW THE WATER.    Take the following medications with a small sip of water on the morning of surgery: Hydrochlorothiazide, and omeprazole   Bring c-pap machine    ?? If you use an inhaler, please use it the morning of surgery and bring with you to hospital.    ?? You may be asked to stop blood thinners such as:  Coumadin, Plavix, Fragmin and lovenox.  Please check with your doctor before stopping these or any other medications.    ?? Aspirin, ibuprofen, advil and naproxen, any anti-inflammatory products should be stopped for a week prior to your surgery.  Stop diclofenac and omega three    ?? Do not smoke and do not drink any alcoholic beverages 24 hours prior to your surgery.    ?? Please do not wear any jewelry or body piercings on the day of surgery.    ?? Please wear something simple, loose fitting clothing to the hospital.  Do not wear any make-up(including eye make-up) or nail polish on your fingers and toes.    ?? As part of our patient safety program to minimize surgical infections, we ask you to do the following:                    1. Please notify your surgeon if you develop any illness between now and the day of your surgery.  This includes a cough, cold, fever, sore throat, nausea, vomiting, diarrhea, etc.  Also                     please notify your surgeon if you experience dizziness, shortness of breath or                       Blurred vision between now and the time of your surgery.                   2.  Please notify your surgeon of any open or redden areas that may                        look infected                     3.  DO NOT shave your operative site 96 hours(four days) prior to surgery.                        4.  Shower the week before surgery with an antibacterial soap,  such as                       dial, safeguard, etc.                         5.  Three(3) days prior to your surgery, cleanse the operative site with Hibiclens(anti-microbial soap).  This soap may dry your skin, please do not apply any oils or lotions     ?? Please bring your insurance card and picture ID day of surgery    ?? If you have a living will or durable power of  attorny. Please bring in a copy of your advanced directives.    ?? If you have dentures, they will be removed before going to the OR, we will provide you with a container.  If you wear contact lenses or glasses, they will be removes, please bring a case for them.    ?? Have you seen your family doctor for a pre-op history and physical.      ?? Surgery scheduler will call you 48 hours prior to your surgery to notify you of the time of your surgery and the time you will need to be at hospital...patients are asked to arrive 21/2 hours prior to surgery.    ??  Please call Pre-Admission testing if you have any further questions.                  [facility] Pre-Admission testing phone number:  [phone]      Thank You for choosing [facility]!!

## 2012-10-11 NOTE — Progress Notes (Signed)
Per JET calls interview>>>Pt plans to return to her one story home with her husband. Pt thinks surgeon made a referral to a home care agency and she will call my no.to leave that information for post op. Regarding Lovenox coverage: I verified with [employer] in [place](ph [phone]) that 40 mg x 7 days would cost the patient $35.00 co pay. Will inform pt post op.Should Carliyah not be able to return home immediately after acute care, she realizes inpt rehab here at [facility] would likely be denied due to insurance restrictions. She would reluctantly entertain skilled care at [facility] or [facility] needed. A precert would need to be obtained. Will follow post op.  >Peggy Seneca Gadbois R.N.  Case Manager  [phone]

## 2012-10-11 NOTE — Telephone Encounter (Signed)
When you can, please review 12/31 pre-op labs and EKG for clearance// her surgery is on Monday----THANKS!!!!

## 2012-10-12 NOTE — Telephone Encounter (Signed)
Pt cleared---info faxed to [facility] pre-admission testing @ fax # [phone]

## 2012-10-15 MED ORDER — CIPROFLOXACIN HCL 500 MG PO TABS
500 MG | ORAL_TABLET | Freq: Two times a day (BID) | ORAL | Status: DC
Start: 2012-10-15 — End: 2012-10-18

## 2012-10-15 NOTE — Telephone Encounter (Signed)
Patient needs antibiotic for surgery.  Called in Cipro for today

## 2012-10-16 ENCOUNTER — Inpatient Hospital Stay: Admit: 2012-10-16 | Source: Ambulatory Visit

## 2012-10-16 LAB — MICROSCOPIC URINALYSIS: RBC, UA: NONE SEEN /HPF (ref 0–2)

## 2012-10-16 LAB — URINALYSIS
Bilirubin Urine: NEGATIVE mg/dL
Blood, Urine: NEGATIVE
Glucose, Ur: NEGATIVE mg/dL
Ketones, Urine: NEGATIVE mg/dL
Nitrite, Urine: POSITIVE — AB
Protein, UA: NEGATIVE mg/dL
Specific Gravity, UA: 1.02 (ref 1.005–1.030)
Urobilinogen, Urine: 0.2 E.U./dL (ref <2.0)
pH, UA: 6 (ref 5.0–8.0)

## 2012-10-16 LAB — POCT GLUCOSE: POC Glucose: 166 mg/dl — ABNORMAL HIGH (ref 70–99)

## 2012-10-16 LAB — EKG 12-LEAD
Atrial Rate: 64 {beats}/min
P Axis: 10 degrees
P-R Interval: 162 ms
Q-T Interval: 420 ms
QRS Duration: 100 ms
QTc Calculation (Bazett): 433 ms
R Axis: 28 degrees
T Axis: 28 degrees
Ventricular Rate: 64 {beats}/min

## 2012-10-16 MED ORDER — OXYCODONE-ACETAMINOPHEN 5-325 MG PO TABS
5-325 | ORAL | Status: DC | PRN
Start: 2012-10-16 — End: 2012-10-18
  Administered 2012-10-17 – 2012-10-18 (×2): 1 via ORAL

## 2012-10-16 MED ORDER — OXYCODONE-ACETAMINOPHEN 5-325 MG PO TABS
5-325 | ORAL | Status: DC | PRN
Start: 2012-10-16 — End: 2012-10-16

## 2012-10-16 MED ORDER — MORPHINE SULFATE (PF) 2 MG/ML IV SOLN
2 | INTRAVENOUS | Status: DC | PRN
Start: 2012-10-16 — End: 2012-10-16

## 2012-10-16 MED ORDER — HYDROMORPHONE HCL PF 1 MG/ML IJ SOLN
1 | INTRAMUSCULAR | Status: DC | PRN
Start: 2012-10-16 — End: 2012-10-18

## 2012-10-16 MED ORDER — NORMAL SALINE FLUSH 0.9 % IV SOLN
0.9 % | Freq: Two times a day (BID) | INTRAVENOUS | Status: DC
Start: 2012-10-16 — End: 2012-10-16
  Administered 2012-10-16: 14:00:00 via INTRAVENOUS

## 2012-10-16 MED ORDER — FENTANYL CITRATE 0.05 MG/ML IJ SOLN
0.05 | INTRAMUSCULAR | Status: DC | PRN
Start: 2012-10-16 — End: 2012-10-16

## 2012-10-16 MED ORDER — NORMAL SALINE FLUSH 0.9 % IV SOLN
0.9 % | INTRAVENOUS | Status: DC | PRN
Start: 2012-10-16 — End: 2012-10-16

## 2012-10-16 MED ORDER — SODIUM CHLORIDE 0.9 % IV SOLN
0.9 % | INTRAVENOUS | Status: DC
Start: 2012-10-16 — End: 2012-10-16
  Administered 2012-10-16: 12:00:00 via INTRAVENOUS

## 2012-10-16 MED ORDER — ONDANSETRON HCL 4 MG/2ML IJ SOLN
4 | Freq: Once | INTRAMUSCULAR | Status: AC | PRN
Start: 2012-10-16 — End: 2012-10-16

## 2012-10-16 MED ORDER — FAMOTIDINE 10 MG/ML IV SOLN
10 MG/ML | Freq: Once | INTRAVENOUS | Status: AC
Start: 2012-10-16 — End: 2012-10-16
  Administered 2012-10-16: 12:00:00 via INTRAVENOUS

## 2012-10-16 MED ORDER — ACETAMINOPHEN 325 MG PO TABS
325 | ORAL | Status: DC | PRN
Start: 2012-10-16 — End: 2012-10-18

## 2012-10-16 MED ADMIN — ondansetron (ZOFRAN) injection 4 mg: INTRAVENOUS | @ 15:00:00 | NDC 00409475503

## 2012-10-16 MED ADMIN — terbinafine (LAMISIL) tablet 125 mg: ORAL | @ 22:00:00 | NDC 55111025090

## 2012-10-16 MED ADMIN — famotidine (PEPCID) 10 MG/ML injection: @ 12:00:00 | NDC 00069012102

## 2012-10-16 MED ADMIN — calcium-vitamin D (OSCAL-500) 500-200 MG-UNIT per tablet 1 tablet: ORAL | @ 21:00:00 | NDC 00182443910

## 2012-10-16 MED ADMIN — insulin lispro (HUMALOG) injection 0-6 Units: SUBCUTANEOUS | @ 23:00:00 | NDC 00002751017

## 2012-10-16 MED ADMIN — 0.9 % sodium chloride infusion: INTRAVENOUS | @ 21:00:00 | NDC 00409798309

## 2012-10-16 MED ADMIN — ciprofloxacin (CIPRO) IVPB 400 mg: INTRAVENOUS | @ 20:00:00 | NDC 00409477702

## 2012-10-16 MED ADMIN — clindamycin (CLEOCIN) 600 mg in dextrose 5 % 50 mL IVPB: INTRAVENOUS | @ 22:00:00 | NDC 00781328991

## 2012-10-16 MED ADMIN — clindamycin (CLEOCIN) 600 mg in dextrose 5 % 50 mL IVPB: INTRAVENOUS | @ 12:00:00 | NDC 00009337502

## 2012-10-16 MED ADMIN — oxyCODONE-acetaminophen (PERCOCET) 5-325 MG per tablet 2 tablet: 2 | ORAL | @ 21:00:00 | NDC 00406051223

## 2012-10-16 MED FILL — TERBINAFINE HCL 250 MG PO TABS: 250 MG | ORAL | Qty: 1

## 2012-10-16 MED FILL — METOPROLOL SUCCINATE ER 25 MG PO TB24: 25 MG | ORAL | Qty: 1

## 2012-10-16 MED FILL — NAROPIN 5 MG/ML IJ SOLN: 5 MG/ML | INTRAMUSCULAR | Qty: 90

## 2012-10-16 MED FILL — DEXAMETHASONE SODIUM PHOSPHATE 4 MG/ML IJ SOLN: 4 MG/ML | INTRAMUSCULAR | Qty: 5

## 2012-10-16 MED FILL — SODIUM CHLORIDE 0.9 % IV SOLN: 0.9 % | INTRAVENOUS | Qty: 1000

## 2012-10-16 MED FILL — PROPOFOL 10 MG/ML IV EMUL: 10 MG/ML | INTRAVENOUS | Qty: 20

## 2012-10-16 MED FILL — HUMALOG 100 UNIT/ML SC SOLN: 100 UNIT/ML | SUBCUTANEOUS | Qty: 3

## 2012-10-16 MED FILL — GLYCOPYRROLATE 0.2 MG/ML IJ SOLN: 0.2 MG/ML | INTRAMUSCULAR | Qty: 2

## 2012-10-16 MED FILL — FAMOTIDINE 10 MG/ML IV SOLN: 10 MG/ML | INTRAVENOUS | Qty: 2

## 2012-10-16 MED FILL — CLEOCIN IN D5W 600 MG/50ML IV SOLN: 600 MG/50ML | INTRAVENOUS | Qty: 50

## 2012-10-16 MED FILL — OXYCODONE-ACETAMINOPHEN 5-325 MG PO TABS: 5-325 MG | ORAL | Qty: 2

## 2012-10-16 MED FILL — MORPHINE SULFATE (PF) 0.5 MG/ML IJ SOLN: 0.5 MG/ML | INTRAMUSCULAR | Qty: 10

## 2012-10-16 MED FILL — CIPROFLOXACIN IN D5W 400 MG/200ML IV SOLN: 400 MG/200ML | INTRAVENOUS | Qty: 200

## 2012-10-16 MED FILL — FENTANYL CITRATE 0.05 MG/ML IJ SOLN: 0.05 MG/ML | INTRAMUSCULAR | Qty: 2

## 2012-10-16 MED FILL — LIDOCAINE HCL 2 % IJ SOLN: 2 % | INTRAMUSCULAR | Qty: 20

## 2012-10-16 MED FILL — KETOROLAC TROMETHAMINE 30 MG/ML IJ SOLN: 30 MG/ML | INTRAMUSCULAR | Qty: 1

## 2012-10-16 MED FILL — QUELICIN 20 MG/ML IJ SOLN: 20 MG/ML | INTRAMUSCULAR | Qty: 10

## 2012-10-16 MED FILL — SODIUM CHLORIDE 0.9 % IV SOLN: 0.9 % | INTRAVENOUS | Qty: 100

## 2012-10-16 MED FILL — MIDAZOLAM HCL 2 MG/2ML IJ SOLN: 2 MG/ML | INTRAMUSCULAR | Qty: 2

## 2012-10-16 MED FILL — HYDROMORPHONE HCL PF 2 MG/ML IJ SOLN: 2 MG/ML | INTRAMUSCULAR | Qty: 1

## 2012-10-16 MED FILL — OYST-CAL-D 500 500-200 MG-UNIT PO TABS: 500-200 MG-UNIT | ORAL | Qty: 1

## 2012-10-16 MED FILL — ROCURONIUM BROMIDE 50 MG/5ML IV SOLN: 50 MG/5ML | INTRAVENOUS | Qty: 5

## 2012-10-16 MED FILL — ONDANSETRON HCL 4 MG/2ML IJ SOLN: 4 MG/2ML | INTRAMUSCULAR | Qty: 2

## 2012-10-16 MED FILL — HYDROMORPHONE HCL PF 1 MG/ML IJ SOLN: 1 MG/ML | INTRAMUSCULAR | Qty: 0.5

## 2012-10-16 MED FILL — NEOSTIGMINE METHYLSULFATE 1 MG/ML IJ SOLN: 1 MG/ML | INTRAMUSCULAR | Qty: 10

## 2012-10-16 NOTE — Anesthesia Pre-Procedure Evaluation (Signed)
[facility] Department of Anesthesiology  Pre-Anesthesia Evaluation/Consultation       Name:  Rebecca Horn                                         Age:  62 y.o.  MRN:  [id number]           Procedure (Scheduled):  R Total Hip  Surgeon:  Dr. Berneda Rose     Allergies   Allergen Reactions   ??? Penicillins Rash     Patient Active Problem List   Diagnosis   ??? Unspecified sleep apnea   ??? HYPERTENSION   ??? Menopause   ??? Osteopenia   ??? Uterine fibroid   ??? Onychomycosis   ??? Osteoarthritis of right hip     Past Medical History   Diagnosis Date   ??? Unspecified sleep apnea    ??? Hypertension    ??? Menopause    ??? Osteopenia    ??? Uterine fibroid    ??? Osteoarthritis of right hip 08/22/2012   ??? GERD (gastroesophageal reflux disease)    ??? Nausea & vomiting      slight nausea     Past Surgical History   Procedure Laterality Date   ??? Tonsillectomy and adenoidectomy     ??? Foot surgery       had a bone spur on left foot   ??? Fracture surgery       right ankle     History   Substance Use Topics   ??? Smoking status: Never Smoker    ??? Smokeless tobacco: Never Used   ??? Alcohol Use: Yes      Comment: occassionally     Medications  No current facility-administered medications on file prior to encounter.     Current Outpatient Prescriptions on File Prior to Encounter   Medication Sig Dispense Refill   ??? hydrochlorothiazide (MICROZIDE) 12.5 MG capsule TAKE ONE CAPSULE BY MOUTH EVERY DAY  30 capsule  1   ??? terbinafine (LAMISIL) 250 MG tablet TAKE ONE TABLET BY MOUTH EVERY DAY  30 tablet  1   ??? omeprazole (PRILOSEC) 20 MG capsule Take 20 mg by mouth daily.       ??? Fesoterodine Fumarate ER (TOVIAZ) 8 MG TB24 Take 8 mg by mouth Daily.  30 tablet  3   ??? ciprofloxacin (CIPRO) 500 MG tablet Take 1 tablet by mouth 2 times daily for 1 day.  2 tablet  0   ??? metoprolol (TOPROL XL) 25 MG XL tablet Take 1 tablet by mouth daily.  4 tablet  0   ??? calcium carbonate-vitamin D (CALCIUM + D) 600-200 MG-UNIT TABS Take 1 capsule by mouth.       ???  Omega-3 Fatty Acids (FISH OIL) 1000 MG CAPS Take 1,000 mg by mouth daily.       ??? diclofenac (VOLTAREN) 75 MG EC tablet Take 75 mg by mouth 2 times daily.         Current Facility-Administered Medications   Medication Dose Route Frequency Provider Last Rate Last Dose   ??? 0.9 % sodium chloride infusion   Intravenous Continuous Blossom Hoops, MD 75 mL/hr at 10/16/12 0650     ??? sodium chloride flush 0.9 % injection 10 mL  10 mL Intravenous Q12H [facility] Blossom Hoops, MD       ???  sodium chloride flush 0.9 % injection 10 mL  10 mL Intravenous PRN Blossom Hoops, MD       ??? clindamycin (CLEOCIN) 600 mg in dextrose 5 % 50 mL IVPB  600 mg Intravenous Once Elesa Massed, MD         Vital Signs (Current)   Filed Vitals:    10/16/12 0617   BP: 152/98   Pulse: 79   Temp: 97 ??F (36.1 ??C)   Resp: 18     Vital Signs Statistics (for past 48 hrs)     BP  Min: 152/98   Min taken time: 10/16/12 0617  Max: 152/98   Max taken time: 10/16/12 0617  Temp  Avg: 97 ??F (36.1 ??C)  Min: 97 ??F (36.1 ??C)   Min taken time: 10/16/12 0617  Max: 97 ??F (36.1 ??C)   Max taken time: 10/16/12 0617  Pulse  Avg: 79  Min: 79   Min taken time: 10/16/12 0617  Max: 79   Max taken time: 10/16/12 0617  Resp  Avg: 18  Min: 18   Min taken time: 10/16/12 0617  Max: 18   Max taken time: 10/16/12 0617  SpO2  Avg: 94 %  Min: 94 %   Min taken time: 10/16/12 0617  Max: 94 %   Max taken time: 10/16/12 0617    BP Readings from Last 3 Encounters:   10/16/12 152/98   10/09/12 130/84   08/22/12 146/78     BMI  Body mass index is 40.59 kg/(m^2).  Estimated body mass index is 40.59 kg/(m^2) as calculated from the following:    Height as of this encounter: 5\' 2"  (1.575 m).    Weight as of this encounter: 222 lb (100.699 kg).    CBC   Lab Results   Component Value Date    WBC 7.7 10/11/2012    RBC 4.29 10/11/2012    HGB 13.5 10/11/2012    HCT 40.3 10/11/2012    MCV 93.9 10/11/2012    RDW 14.4 10/11/2012    PLT 223 10/11/2012     CMP    Lab Results   Component Value Date    NA 140 10/11/2012    K  3.8 10/11/2012    CL 100 10/11/2012    CO2 30 10/11/2012    BUN 18 10/11/2012    CREATININE 0.5 10/11/2012    GFRAA >60 10/11/2012    GFRAA >60 02/01/2012    AGRATIO 2.0 10/09/2012    LABGLOM >60 10/11/2012    LABGLOM >60 08/17/2009    GLUCOSE 88 10/11/2012    PROT 6.2 10/09/2012    CALCIUM 9.1 10/11/2012    BILITOT 0.2 10/09/2012    ALKPHOS 67 10/09/2012    AST 19 10/09/2012    ALT 18 10/09/2012     BMP    Lab Results   Component Value Date    NA 140 10/11/2012    K 3.8 10/11/2012    CL 100 10/11/2012    CO2 30 10/11/2012    BUN 18 10/11/2012    CREATININE 0.5 10/11/2012    CALCIUM 9.1 10/11/2012    GFRAA >60 10/11/2012    GFRAA >60 02/01/2012    LABGLOM >60 10/11/2012    LABGLOM >60 08/17/2009    GLUCOSE 88 10/11/2012     POCGlucose  No results found for this basename: GLUCOSE,  in the last 72 hours   Coags    Lab Results   Component Value Date  PROTIME 9.9 10/11/2012    INR 0.88 10/11/2012    APTT 30.0 10/11/2012     HCG (If Applicable)   No results found for this basename: PREGTESTUR, PREGSERUM, HCG, HCGQUANT      ABGs   No results found for this basename: PHART, PO2ART, PCO2ART, HCO3ART, BEART, O2SATART      Type & Screen (If Applicable)  No results found for this basename: LABABO, LABRH          Anesthesia Evaluation     Patient summary reviewed    History of anesthetic complications (ponv/motion)   Airway   Mallampati: III  TM distance: <3 FB  Neck ROM: full  Dental      Pulmonary     breath sounds clear to auscultation  (+) sleep apnea on CPAP ,   (-) COPD, asthma, shortness of breath, recent URI  Cardiovascular   (+) hypertension (couple yrs),   (-) pacemaker, valvular problems/murmurs, past MI, CAD, CABG/stent, dysrhythmias, CHF    ECG reviewed  Rhythm: regular  Rate: normal  Beta Blocker:  Order Written    Neuro/Psych    (-) seizures, neuromuscular disease, TIA, CVA, headaches, psychiatric history  GI/Hepatic/Renal    (+) GERD poorly controlled,   (-) liver disease, renal disease    Endo/Other  (+) , arthritis  (-) no type I diabetes, no type II  diabetes, hypothyroidism, hyperthyroidism, blood dyscrasia  Abdominal   (+) obese,   Abdomen: soft.       Other findings: Took toprol 3 days preop per pcp, will dose in or           Allergies: Penicillins    NPO Status: Time of last liquid consumption: 2100                       Time of last solid food consumption: 2100    Anesthesia Plan    ASA 3     general     intravenous induction   Anesthetic plan and risks discussed with patient.    Plan discussed with CRNA.      DOS STAFF ADDENDUM:    Pt seen and examined, chart reviewed (including anesthesia, drug and allergy history).  No interval changes to history and physical examination.  Anesthetic plan, risks, benefits, alternatives, and personnel involved discussed with patient.  Patient verbalized an understanding and agrees to proceed.      Tonie Griffith, MD  October 16, 2012  6:53 AM      Coralie Keens [facility]  10/16/2012

## 2012-10-16 NOTE — Anesthesia Post-Procedure Evaluation (Signed)
[facility] Department of Anesthesiology  Post-Anesthesia Note       Name:  NAKITA SANTERRE                                         Age:  62 y.o.  MRN:  [id number]     Last Vitals & Oxygen Saturation: BP 116/66   Pulse 59   Temp(Src) 97.6 ??F (36.4 ??C) (Temporal)   Resp 14   Ht 5\' 2"  (1.575 m)   Wt 222 lb (100.699 kg)   BMI 40.59 kg/m2   SpO2 100%  Patient Vitals for the past 4 hrs:   BP Temp Temp src Pulse Resp SpO2   10/16/12 1015 116/66 mmHg - - 59 14 100 %   10/16/12 1000 133/66 mmHg - - 63 13 99 %   10/16/12 0955 138/62 mmHg 97.6 ??F (36.4 ??C) Temporal 68 18 100 %   10/16/12 0950 127/70 mmHg - - 63 15 100 %   10/16/12 0945 127/61 mmHg - - 64 12 100 %   10/16/12 0940 138/62 mmHg - - 68 15 99 %       Level of consciousness: awake, alert  and oriented    Respiratory: stable     Cardiovascular: stable     Hydration: stable     PONV: stable     Post-op pain: Adequate analgesia    Post-op assessment: no apparent anesthetic complications and tolerated procedure well    Complications:  none    Tonie Griffith, MD  October 16, 2012   10:30 AM

## 2012-10-16 NOTE — Progress Notes (Signed)
C/o nausea medicated per order

## 2012-10-16 NOTE — Progress Notes (Signed)
Xrays right hip taken

## 2012-10-16 NOTE — Progress Notes (Signed)
Patient arrived to 3112 via bed from PACU. Patient is alert and oriented. Patient and family oriented to floor, room, call light, phone and hospital policies. Patient and family verbalized understanding and denied having any questions.

## 2012-10-16 NOTE — H&P (Signed)
The patient was interviewed and examined and there have been no changes since the documented History and Physical.    Electronically signed by Deland Pretty, MD on 10/16/2012 at 7:15 AM

## 2012-10-16 NOTE — Op Note (Signed)
PATIENT NAME:         Rebecca Horn  DATE OF BIRTH:         1951-02-10   MEDICAL RECORD NUMBER         [id number]  SURGERY DATE:         10/16/2012  SURGEON:                 Deland Pretty          PREOPERATIVE DIAGNOSIS: Severe end-stage osteoarthritis Right Hip.     POSTOPERATIVE DIAGNOSIS: Severe end-stage osteoarthritis Right hip.     PROCEDURE: Right total hip arthroplasty.     ANESTHESIA: General anesthesia.     IV FLUIDS: Crystalloid.    ESTIMATED BLOOD LOSS: .     COMPLICATIONS: None. The patient tolerated the procedure quite well.     COMPONENTS: A Zimmer size 14 Fiber Metal MidCoat standard body standard offset stem, a size 48 Continuum trabecular metal cup, a size 32 -3.5 femoral head, and a standard ultrahigh molecular weight polyethylene liner with vitamin E.     INDICATIONS:  The patient is a 62 y.o. female with a long-standing   history of right hip pain. X-rays confirmed severe osteoarthritis. Despite conservative measures, the patient had severe persistent pain. Due to this   fact, the patient was ultimately cleared and scheduled for total hip arthroplasty.     REPORT: The patient was identified preoperatively. The patient was then   taken to the operating room, and general anesthesia was administered by   Anesthesia. Appropriate preoperative antibiotics were administered per SCIP   measures. A Foley catheter was inserted preoperatively. The patient was   then positioned in the lateral decubitus position on the pegboard. All bony   prominences were padded. We then ChloraPrepped the hip, pelvis, and   lower extremity in standard fashion. We then draped out in standard fashion.   Our incision was then marked accordingly. We used a standard posterolateral   skin incision. We then sealed off the skin with an [place]an drape. We then were   ready for skin incision. Using a standard posterolateral skin incision, we   incised skin and coagulated all bleeders with Bovie electrocautery. We then    dissected down and identified the gluteofemoral fascia. This was split   longitudinally in line with the muscle fibers. We then used our Charnley   retractor and retracted both anteriorly and posteriorly. The sciatic nerve   was identified and protected at all times. We then retracted the abductors   anteriorly. We took down the piriformis. This was tagged and released. We   then took down the remainder of the short external rotators. The capsule was   then Td. We then expressed the hip out of the wound and dislocated the hip.   This worked out extremely well. We then were ready for our femoral cuts.   Using our neck cutting guide, we then went ahead and made a neck cut   approximately 1 cm proximal to the lesser trochanter based on templating. This worked out extremely well. We then used our boxed osteotome to remove the bone   laterally. We then used our trochanteric router and reamed the bone   laterally. We then sequentially reamed up to 14 mm. The femoral canal was then broached up to 14. Appropriate anteversion was maintained throughout the broaching process. This gave [place] a nice fit and fill of the canal.   We then directed our attention towards  the acetabulum. We removed the labrum   in its entirety. We then split the capsule inferiorly. We   then retracted both anteriorly and posteriorly. We then were ready for   reaming. We first gently medialized with a size 41 reamer. We then went   ahead and reamed sequentially up to 47 mm, maintaining approximately   45 degrees of abduction and approximately 30 degrees of anteversion. This   worked out extremely well. We were satisfied with the preparation of the   acetabulum. We were satisfied with the nice bleeding bed of bone with good   bone. We irrigated rather copiously. We then were ready for insertion of the actual Continuum cup. We maintained appropriate anteversion and   abduction. The cup was seated without difficulty. We then inserted a trial   Liner. We  then went ahead and inserted our trial stem. The trial head was attached and the hip was reduced. The hip was extremely stable and leg lengths were symmetric with the 32-3.5 head. The hip was extremely stable both anteriorly and posteriorly. We were able to flex the hip up to 90 degrees and internally rotate to approximately 60 degrees. We then went ahead and removed all trial components. We removed the trial liner. We then went ahead and seated our ultrahigh molecular weight polyethylene   liner with vitamin E. This worked out extremely well. We irrigated the   femoral canal rather copiously. We then went ahead and inserted our actual stem. We then went ahead and attached our 32 -3.5 ceramic head. This worked out extremely well. We reduced the hip. Again, the hip was stable. We irrigated all layers rather copiously. We   closed the capsule with interrupted figure-of-eight #1 Vicryl stitches. We   then reattached the piriformis with interrupted vertical mattress #1 Vicryl   stitches. We then closed the gluteofemoral fascia with interrupted   figure-of-eight #1 Vicryl stitches. We then closed the subcutaneous tissues   with interrupted 2-0 simple Vicryl stitches. We then closed the skin with   staples. Sterile dressings were applied. The patient was put in an abductor   pillow. There were no complications.

## 2012-10-16 NOTE — Plan of Care (Signed)
1.  Patient is identified using name and date of birth.  2.  The patient is free from signs and symptoms of injury.  3.  The patient receives appropriate medication(s), safely administered during the perioperative period.  4.  The patient had wound/tissuue perfusion consistent with or improved from baseline levels established preoperatively.  5.  The patient is at or returning to normothermia at the conclusion of the immediate postoperative period.  6.  The patient's fluid, electrolyte, and acid base balances are consistent with or improved from baseline levels established preoperatively.  7.  The patient's pulmonary function is consistent with or improved from baseline levels established preoperatively.  8.  The patient's cardiovascular status is consistent with or improved from baseline levels established preoperatively.  9.  The patient/caregiver participates in decisions affecting his or her perioperative care.  10.  The patient's care is consistent with the individualized perioperative plan of care.  11.  The patient's right to privacy is maintained.  12.  The patient is the recipient of competent and ethical care within legal standards of practice.  13.  The patient's value system, lifestyle, ethnicity, and culture are considered, respected, and incorporated in the perioperative plan of care.  14.  The patient demonstrates and/or reports adequate pain control throughout the perioperative period.  15.  The patient's neurological status is consistent with or improved from baseline levels established preoperatively.  16.  The patient/caregiver demonstrates knowledge of the expected responses to the operative or invasive procedure.  17.  Patient/caregiver has reduced anxiety.  Interventions - familiarize with environment and equipment.

## 2012-10-16 NOTE — Progress Notes (Signed)
Physical Therapy    Initial Assessment    This note serves as a D/C Summary in the event that this pt is discharged prior to the next therapy session.    Date: 10/16/2012  Patient Name: Rebecca Horn  MRN: [id number]    DOB: [birth year]         Restrictions  Restrictions/Precautions  Restrictions/Precautions: Fall Risk;Weight Bearing  Lower Extremity Weight Bearing Restrictions  Right Lower Extremity Weight Bearing: Weight Bearing As Tolerated  Vision/Hearing  Vision  Vision: Within Functional Limits (glasses)  Hearing  Hearing: Within functional limits     Subjective  General  Chart Reviewed: Yes  Response To Previous Treatment: Not applicable  Family / Caregiver Present: Yes (husband)  Referral Date : 10/16/12  Diagnosis: R THR (1/7)  Follows Commands: Within Functional Limits  Subjective  Subjective: Pt reports feeling more awake/alert this PM but "goes in and out". Pt reports 5/10 pain but that she is agreeable to LE exercises today and understands the Joint (Group) Class schedule for tomorrow.   Pain Screening  Patient Currently in Pain: Yes  Pain Assessment  Pain Assessment: 0-10  Pain Level: 5  Pain Type: Surgical pain  Pain Location: Hip  Pain Orientation: Right  Pain Descriptors: Aching;Discomfort  Pain Frequency: Continuous  Pain Onset: On-going  Clinical Progression: Gradually worsening  Pain Intervention(s): Medication (see eMar)  Response to Pain Intervention: Patient Satisfied  Multiple Pain Sites: No  RASS Score (Ventilated): Alert and calm  POSS Score (Patient Ctrl Analgesia): 1    Orientation  Orientation  Overall Orientation Status: Within Normal Limits  Home Living  Home Living  Lives With: Spouse  Type of Home: House  Home Layout: One level  Home Access: Stairs to enter without rails (1 STE)  Bathroom Shower/Tub: Pension scheme manager: Midwife: Psychologist, prison and probation services Accessibility: Accessible  Home Equipment: Standard walker;4 wheeled  walker;Cane  Objective     Prior Function  Receives Help From: Family  ADL Assistance: Independent  Homemaking Assistance: Independent  Ambulation Assistance: Independent  Transfer Assistance: Independent  Vocational: Full time employment  PROM RLE (degrees)  RLE ROM:  (not formally assessed, as pt was drowsy s/p surgery this AM)  Strength Other  Other: not formally assessed this PM                        Exercises  Quad Sets: supine in bed x10 with 3-5 second holds  Gluteal Sets: supine in bed x10 with 3-5 second holds  Ankle Pumps: supine in bed x10  Comments: Pt instructed on LE exercises to perform in bed this afternoon/evening until Joint Class tomorrow AM. PT also reviewed hip precautions with pt.     Assessment   Assessment: Decreased functional mobility   Assessment: Pt demonstrates decreased functional mobility s/p R THR (1/7) and anticipates steady progression for return home after a short inpatient stay.   Specific instructions for Next Treatment: get OOB for joint class, re-review hip precautions & R LE exercises, ambulate with RW   Prognosis: Good  Discharge Recommendations: Continue to assess pending progress  Requires PT Follow Up: Yes  Time In: 0130  Time Out: 0210  Timed Code Treatment Minutes: 30 Minutes  Total Treatment Time: 40  Activity Tolerance  Activity Tolerance: Patient limited by fatigue  PT D/C Equipment  Equipment Needed: No  Other Comments  Comments: Pt asking a lot of questions regarding POC & about  pain meds; pt occasionally closing eyes throughout session and performing small LE movements with exercises.       Plan   Plan  Times per day: Twice a day  Current Treatment Recommendations: Strengthening;ROM;Functional Mobility Training;Pain Management;Safety Education & Training;Gait Training  Patient Education: safety, role of PT, POC, (Joint) Group Class and what to expect  Safety Devices  Safety Devices in place: Yes  Type of devices: All fall risk precautions in place;Bed alarm in  place;Call light within reach;Left in bed;Nurse notified;Other (comment) (husband in room at end of session)      Goals  Short term goals  Time Frame for Short term goals: Prior to discharge, patient will perform:  Short term goal 1: bed mobility with S/I.  Short term goal 2: transfers with S/MI.  Short term goal 3: ambulate >100' with RW and S/MI.  Short term goal 4: negotiate at least 4 steps with 1-2 HRs and SBA/S.  Short term goal 5: independently perform HEP for R THR.  Long term goals  Time Frame for Long term goals : TBD as needed.       Rebecca Horn  License and Documentation Cosign  Therapy License Number: Renie Ora, DPT [phone]

## 2012-10-16 NOTE — Progress Notes (Signed)
Nausea improved

## 2012-10-16 NOTE — Telephone Encounter (Signed)
Tried to call patient yesterday x 2.  No response.  Patient did not receive prescription.

## 2012-10-16 NOTE — Progress Notes (Signed)
Awakens easily on arrival to PACU.Abduction pillow in place. Right foot warm,pink,cap refill good. Ice p;aced to right hip,

## 2012-10-17 DIAGNOSIS — M1611 Unilateral primary osteoarthritis, right hip: Secondary | ICD-10-CM

## 2012-10-17 LAB — POCT GLUCOSE
POC Glucose: 151 mg/dl — ABNORMAL HIGH (ref 70–99)
POC Glucose: 95 mg/dl (ref 70–99)
POC Glucose: 138 mg/dl — ABNORMAL HIGH (ref 70–99)
POC Glucose: 86 mg/dl (ref 70–99)

## 2012-10-17 LAB — HEMOGLOBIN AND HEMATOCRIT
Hematocrit: 29.8 % — ABNORMAL LOW (ref 36.0–48.0)
Hemoglobin: 9.9 g/dL — ABNORMAL LOW (ref 12.0–16.0)

## 2012-10-17 MED ADMIN — ciprofloxacin (CIPRO) IVPB 400 mg: INTRAVENOUS | @ 09:00:00 | NDC 00409477702

## 2012-10-17 MED ADMIN — ciprofloxacin (CIPRO) IVPB 400 mg: INTRAVENOUS | @ 21:00:00 | NDC 00409477702

## 2012-10-17 MED ADMIN — calcium-vitamin D (OSCAL-500) 500-200 MG-UNIT per tablet 1 tablet: ORAL | @ 14:00:00 | NDC 00182443910

## 2012-10-17 MED ADMIN — sodium chloride flush 0.9 % injection 10 mL: INTRAVENOUS | @ 14:00:00

## 2012-10-17 MED ADMIN — omeprazole (PRILOSEC) capsule 20 mg: ORAL | @ 14:00:00 | NDC 60505006507

## 2012-10-17 MED ADMIN — clindamycin (CLEOCIN) 600 mg in dextrose 5 % 50 mL IVPB: 600 mg | INTRAVENOUS | @ 12:00:00 | NDC 00781328991

## 2012-10-17 MED ADMIN — oxybutynin (DITROPAN-XL) CR tablet 10 mg: ORAL | @ 04:00:00 | NDC 51079072301

## 2012-10-17 MED ADMIN — enoxaparin (LOVENOX) injection 40 mg: SUBCUTANEOUS | @ 14:00:00 | NDC 00075801401

## 2012-10-17 MED ADMIN — sodium chloride flush 0.9 % injection 10 mL: INTRAVENOUS | @ 04:00:00

## 2012-10-17 MED ADMIN — oxyCODONE-acetaminophen (PERCOCET) 5-325 MG per tablet 2 tablet: 2 | ORAL | @ 14:00:00 | NDC 00406051223

## 2012-10-17 MED ADMIN — oxyCODONE-acetaminophen (PERCOCET) 5-325 MG per tablet 2 tablet: 2 | ORAL | @ 18:00:00 | NDC 00406051223

## 2012-10-17 MED ADMIN — oxyCODONE-acetaminophen (PERCOCET) 5-325 MG per tablet 2 tablet: 2 | ORAL | @ 04:00:00 | NDC 00406051223

## 2012-10-17 MED ADMIN — oxyCODONE-acetaminophen (PERCOCET) 5-325 MG per tablet 2 tablet: 2 | ORAL | @ 22:00:00 | NDC 00406051223

## 2012-10-17 MED FILL — OXYCODONE-ACETAMINOPHEN 5-325 MG PO TABS: 5-325 MG | ORAL | Qty: 2

## 2012-10-17 MED FILL — LOVENOX 40 MG/0.4ML SC SOLN: 40 MG/0.4ML | SUBCUTANEOUS | Qty: 0.4

## 2012-10-17 MED FILL — OXYBUTYNIN CHLORIDE ER 10 MG PO TB24: 10 MG | ORAL | Qty: 1

## 2012-10-17 MED FILL — CIPROFLOXACIN IN D5W 400 MG/200ML IV SOLN: 400 MG/200ML | INTRAVENOUS | Qty: 200

## 2012-10-17 MED FILL — OMEPRAZOLE 20 MG PO CPDR: 20 MG | ORAL | Qty: 1

## 2012-10-17 MED FILL — OXYCODONE-ACETAMINOPHEN 5-325 MG PO TABS: 5-325 MG | ORAL | Qty: 1

## 2012-10-17 MED FILL — OYST-CAL-D 500 500-200 MG-UNIT PO TABS: 500-200 MG-UNIT | ORAL | Qty: 1

## 2012-10-17 MED FILL — CLEOCIN IN D5W 600 MG/50ML IV SOLN: 600 MG/50ML | INTRAVENOUS | Qty: 50

## 2012-10-17 MED FILL — TERBINAFINE HCL 250 MG PO TABS: 250 MG | ORAL | Qty: 1

## 2012-10-17 NOTE — Progress Notes (Addendum)
Physical Therapy  Progress Note  This note serves as patient discharge summary if pt discharges prior to next PT visit     10/17/12 1256   Restrictions/Precautions   Restrictions/Precautions Fall Risk;Weight Bearing   Lower Extremity Weight Bearing Restrictions   Right Lower Extremity Weight Bearing Weight Bearing As Tolerated   Position Activity Restriction   Hip Precautions No hip flexion > 90 degrees;No ADduction;No hip internal rotation   General   Referring Practitioner Dr. Berneda Rose   Pain Screening   Patient Currently in Pain Yes   Pain Assessment   Pain Assessment 0-10   Pain Level 4   Pain Type Surgical pain   Pain Location Hip   Pain Orientation Right   Transfers   Sit to Stand Contact guard assistance   Stand to sit Contact guard assistance   Ambulation   Ambulation? Yes   WB Status WBAT   Ambulation 1   Surface level tile   Device Rolling Walker   Assistance Stand by assistance   Quality of Gait SLow, step-to, min antalgic   Distance 35'   Stairs/Curb   Stairs? Yes   Stairs   Curbs 6"   Device Rolling walker   Assistance Contact guard assistance  (CGA, Ascending backwards)   Exercises   Quad Sets x10   Heelslides x5 AA   Gluteal Sets x10   Knee Long Arc Quad x10   Knee Short Arc Quad x10   Ankle Pumps x30   Comments Adductore Sets x10. Written THR Ex program provided   Short term goals   Time Frame for Short term goals 10-17-12 Allm goals ongoing   Short term goal 1 bed mobility with S/I.   Short term goal 2 transfers with S/MI.   Short term goal 3 ambulate >100' with RW and S/MI.   Short term goal 4 negotiate at least 4 steps with 1-2 HRs and SBA/S.   Short term goal 5 independently perform HEP for R THR.   Long term goals   Time Frame for Long term goals  TBD as needed.   Assessment   Specific instructions for Next Treatment progress gait, exs, stairs   Requires PT Follow Up Yes   Time In 1100   Time Out 1130   Plan   Times per week daily-BID       Electronically signed by Rafael Bihari, PT [phone] on  10/17/2012 at 5:08 PM

## 2012-10-17 NOTE — Progress Notes (Signed)
Department of Orthopedic Surgery    Progress Note        Subjective:  No complaints.  Doing well postoperatively. pain is perceived as moderate (4-6 pain scale)      Vitals  VITALS:  BP 107/65   Pulse 69   Temp(Src) 98.3 ??F (36.8 ??C) (Oral)   Resp 14   Ht 5\' 2"  (1.575 m)   Wt 222 lb (100.699 kg)   BMI 40.59 kg/m2   SpO2 100%    PHYSICAL EXAM:    Orientation:  alert and oriented to person, place and time    Right Lower Extremity    Incision:  dressing in place, clean, dry and intact    Lower Extremity Motor :    Moving lower extremities without difficulty today.  Able to dorsiflex and plantar flex foot/ankle.       Lower Extremity Sensory:   Neurovascularly intact to gross sensation and touch in lower extremities.  Pulses:    present 2+ bilaterally lower extremities.    Abnormal Exam findings:  none    Brace: Intact and will continue to wear during inpatient stay.    LABS:    HgB:    Lab Results   Component Value Date    HGB 9.9 10/17/2012     INR:    No components found with this basename: PTPATIENT, PTINR     CBC:   Lab Results   Component Value Date    WBC 7.7 10/11/2012    RBC 4.29 10/11/2012    HGB 9.9 10/17/2012    HCT 29.8 10/17/2012    MCV 93.9 10/11/2012    MCH 31.5 10/11/2012    MCHC 33.5 10/11/2012    RDW 14.4 10/11/2012    PLT 223 10/11/2012    MPV 9.0 10/11/2012       ASSESSMENT AND PLAN:    Post operative day 1 status post right total hip arthroplasty.    1:  Weight bearing as tolerated  2:  Continue Deep venous thrombosis prophylaxis  3:  Continue physical therapy  4:  D/C Plan:  Home Health.  5:  Continue Pain Control   6:  Acute blood loss anemia - expected after surgery. Will monitor Hgb

## 2012-10-17 NOTE — Progress Notes (Signed)
Physical Therapy  Progress Note  This note serves as patient discharge summary if pt discharges prior to next PT visit     10/17/12 1700   Lower Extremity Weight Bearing Restrictions   Right Lower Extremity Weight Bearing Weight Bearing As Tolerated   Position Activity Restriction   Hip Precautions No hip flexion > 90 degrees;No ADduction;No hip internal rotation   General   Chart Reviewed Yes   Referring Practitioner Dr. Berneda Rose   Pain Screening   Patient Currently in Pain Yes   Pain Assessment   Pain Assessment 0-10   Pain Level 3   Pain Type Surgical pain   Pain Location Hip   Pain Orientation Right   Transfers   Sit to Stand Stand by assistance   Stand to sit Stand by assistance   Ambulation   WB Status WBAT   Ambulation 1   Surface level tile   Device Rolling Walker   Assistance Stand by assistance   Quality of Gait Slow but fluid, step through pattern. Wide BOS.     Distance 60'   Stairs/Curb   Stairs? Yes   Stairs   Curbs 6"   Device Rolling walker   Assistance Contact guard assistance  (CGA, Ascending backwards)   Exercises   Quad Sets x10   Heelslides x5 AA   Gluteal Sets x10   Knee Long Arc Quad x10   Knee Short Arc Quad x10   Ankle Pumps x30   Comments Adductore Sets x10. Written THR Ex program provided   Short term goals   Time Frame for Short term goals 10-17-12 Allm goals ongoing   Short term goal 1 bed mobility with S/I.   Short term goal 2 transfers with S/MI.   Short term goal 3 ambulate >100' with RW and S/MI.   Short term goal 4 negotiate at least 4 steps with 1-2 HRs and SBA/S.   Short term goal 5 independently perform HEP for R THR.   Assessment   Specific instructions for Next Treatment progress gait, exs, stairs   Requires PT Follow Up Yes   Time In 0245   Time Out 0315   Activity Tolerance   Activity Tolerance Patient Tolerated treatment well   PT D/C Equipment   Equipment Needed No   Plan   Times per week daily-BID   Progress Note   See Progress Note Yes   PT Whiteboard Notes   Therapy  Whiteboard 1/7 T,W       Electronically signed by Rafael Bihari, PT [phone] on 10/17/2012 at 5:17 PM

## 2012-10-17 NOTE — Progress Notes (Signed)
ATTEMPTED TO SEE PT TO CONFIRM [facility] UPON DC; PT WAS UNAVAILABLE TO SW AT THIS TIME. WILL FOLLOW UP WITH AT LATER TIME.   Electronically signed by Elvin So. Pistor on 10/17/2012 at 11:20 AM

## 2012-10-17 NOTE — Plan of Care (Signed)
Problem: Anxiety  Goal: Able to identify anxiety triggers  Outcome: Completed Date Met:  10/17/12  Patient able to adequately voice feelings of high anxiety and receive appropriate pharmacological and nonpharmacologic medication.     Electronically signed by Lorin Glass, RN on 10/17/2012 at 9:20 AM

## 2012-10-17 NOTE — Progress Notes (Signed)
Pt up in chair.  No complaints at this time.  Needs attended.  Call light with in reach.  Will continue to monitor.

## 2012-10-17 NOTE — Progress Notes (Signed)
D/c f/c per md orders.  Pt tolerated well.  Will monitor site.

## 2012-10-17 NOTE — Progress Notes (Signed)
Pt awake.   Alert and oriented x4.  Denies nausea.  No sob noted.  C/o pain right hip 3/10.  Assessment complete. pts needs attended call light within reach will continue to monitor.

## 2012-10-17 NOTE — Plan of Care (Signed)
Problem: Anxiety  Goal: Alleviation of anxiety  Outcome: Completed Date Met:  10/17/12  Patient displays appropriate responses to anti-anxiety measures.    Electronically signed by Lorin Glass, RN on 10/17/2012 at 9:21 AM

## 2012-10-17 NOTE — Progress Notes (Signed)
Occupational Therapy   Occupational Therapy Initial Assessment  Date: 10/17/2012   Patient Name: Rebecca Horn  MRN: [id number]     DOB: 08-20-1951           Restrictions  Restrictions/Precautions WBAT, Posterior lateral precautions, No hip flex>90*,No internal rotation,No hip ADduction  Restrictions/Precautions: Fall Risk;Weight Bearing  Lower Extremity Weight Bearing Restrictions  Right Lower Extremity Weight Bearing: Weight Bearing As Tolerated  Vision/Hearing  Vision  Vision: Within Functional Limits (glasses)  Hearing  Hearing: Within functional limits  Subjective   General  Additional Pertinent Hx: R THA,posteriolateal approach ,L knee pain,WBAT  Family / Caregiver Present: No  Referring Practitioner: Dr Berneda Rose  Diagnosis: R THA POD 1  Pain Screening  Patient Currently in Pain: Yes  Pain Assessment: 0-10  Pain Level: 4  Pain Type: Surgical pain  Pain Location: Hip  Pain Orientation: Right  Pain Descriptors: Aching;Discomfort  Pain Frequency: Continuous  Clinical Progression: Gradually worsening  Pain Intervention(s): Medication (see eMar)  Multiple Pain Sites: No  Home Living  Home Living  Lives With: Spouse  Type of Home: House  Home Layout: One level  Home Access: Stairs to enter without rails (1 STE)  Bathroom Shower/Tub: Pension scheme manager: Handicap height (comfort height commode in main bathroom)  Bathroom Equipment: Psychologist, prison and probation services Accessibility: Accessible  Home Equipment: Standard walker;4 wheeled walker;Cane  Additional Comments: No LE ADL equip     Orientation  Orientation  Overall Orientation Status: Within Normal Limits  Objective      Prior Function  Receives Help From: Family  ADL Assistance: Independent  Homemaking Assistance: Independent  Ambulation Assistance: Independent  Transfer Assistance: Independent  Vocational: Full time employment     Balance  Sitting Balance: Independent  Standing Balance: Contact guard assistance  ADL  Feeding: Independent  Grooming:  Setup  UE Bathing: Minimal assistance  LE Bathing: Maximum assistance  UE Dressing: Minimal assistance  LE Dressing: Maximum assistance  Toileting: Moderate assistance (assist w/clothing)  Transfer: Contact guard assistance        Bed mobility  Sit to Stand: Contact guard assistance  Bed to Chair: Contact guard assistance        Cognition  Overall Cognitive Status: WNL  Perception  Overall Perceptual Status: WFL  Sensation  Overall Sensation Status: WNL     ROM  ROM: WFL  LUE Strength  Gross LUE Strength: WFL  RUE Strength  Gross RUE Strength: WFL                  Assessment   Assessment  Assessment: Decreased functional mobility ;Decreased ADL status  Rehab Potential: Good  Recommendations: Home with assist as needed;Home OT  Requires OT Follow Up: Yes  Time In: 1210  Timed Code Treatment Minutes: 40 Minutes  Total Treatment Time: 60  Response to Treatment  Response to Treatment: Patient Tolerated treatment well  Safety Devices  Safety Devices in place: Yes  Type of devices: Call light within reach;Chair alarm in place;Patient at risk for falls;Left in chair       Plan   Plan  Times per week: 3-5  Times per day: Daily  Patient Education: Ed pt in Role of OT,POC, Posterior hip precautions, availability of equipment for LE ADLs.walker safety. Pt verbalized understanding of all.         Goals  Short term goals  Time Frame for Short term goals: by D/C  Short term goal 1: MinA bed mobility  Short  term goal 2: S/ set up UB ADLs  Short term goal 3: Min A LB ADLs  Short term goal 4:  Maintain Posterior hip precautions in all above activities.  Short term goal 5: return demo functional use of LE ADL equipment w/cues.  Long term goals  Time Frame for Long term goals : TBD next care level     If patient is discharged prior to next OT session, see last written OT note for Discharge status.    Duane Boston  OT/L # [id number]

## 2012-10-18 LAB — URINALYSIS
Bilirubin Urine: NEGATIVE mg/dL
Blood, Urine: NEGATIVE
Glucose, Ur: NEGATIVE mg/dL
Ketones, Urine: NEGATIVE mg/dL
Nitrite, Urine: NEGATIVE
Protein, UA: NEGATIVE mg/dL
Specific Gravity, UA: 1.025 (ref 1.005–1.030)
Urobilinogen, Urine: 0.2 E.U./dL (ref <2.0)
pH, UA: 6 (ref 5.0–8.0)

## 2012-10-18 LAB — MICROSCOPIC URINALYSIS

## 2012-10-18 LAB — HEMOGLOBIN AND HEMATOCRIT
Hematocrit: 29.2 % — ABNORMAL LOW (ref 36.0–48.0)
Hemoglobin: 9.8 g/dL — ABNORMAL LOW (ref 12.0–16.0)

## 2012-10-18 LAB — POCT GLUCOSE: POC Glucose: 99 mg/dl (ref 70–99)

## 2012-10-18 MED ORDER — CIPROFLOXACIN HCL 500 MG PO TABS
500 MG | ORAL_TABLET | Freq: Two times a day (BID) | ORAL | Status: AC
Start: 2012-10-18 — End: 2012-10-21

## 2012-10-18 MED ORDER — HYDROCHLOROTHIAZIDE 12.5 MG PO CAPS
12.5 MG | ORAL_CAPSULE | Freq: Every day | ORAL | Status: DC
Start: 2012-10-18 — End: 2012-12-24

## 2012-10-18 MED ORDER — OXYCODONE-ACETAMINOPHEN 5-325 MG PO TABS
5-325 MG | ORAL_TABLET | Freq: Four times a day (QID) | ORAL | Status: DC | PRN
Start: 2012-10-18 — End: 2012-10-29

## 2012-10-18 MED ORDER — ENOXAPARIN SODIUM 40 MG/0.4ML SC SOLN
40 MG/0.4ML | INJECTION | Freq: Every day | SUBCUTANEOUS | Status: AC
Start: 2012-10-18 — End: 2012-10-26

## 2012-10-18 MED ADMIN — calcium-vitamin D (OSCAL-500) 500-200 MG-UNIT per tablet 1 tablet: ORAL | @ 14:00:00 | NDC 00182443910

## 2012-10-18 MED ADMIN — enoxaparin (LOVENOX) injection 40 mg: SUBCUTANEOUS | @ 14:00:00 | NDC 00075801401

## 2012-10-18 MED ADMIN — omeprazole (PRILOSEC) capsule 20 mg: ORAL | @ 14:00:00 | NDC 60505006507

## 2012-10-18 MED ADMIN — ciprofloxacin (CIPRO) IVPB 400 mg: INTRAVENOUS | @ 08:00:00 | NDC 00409477702

## 2012-10-18 MED ADMIN — sodium chloride flush 0.9 % injection 10 mL: INTRAVENOUS | @ 03:00:00

## 2012-10-18 MED ADMIN — oxybutynin (DITROPAN-XL) CR tablet 10 mg: ORAL | @ 03:00:00 | NDC 51079072301

## 2012-10-18 MED ADMIN — oxyCODONE-acetaminophen (PERCOCET) 5-325 MG per tablet 2 tablet: 2 | ORAL | @ 03:00:00 | NDC 00406051223

## 2012-10-18 MED ADMIN — oxyCODONE-acetaminophen (PERCOCET) 5-325 MG per tablet 2 tablet: 2 | ORAL | @ 14:00:00 | NDC 00406051223

## 2012-10-18 MED ADMIN — oxyCODONE-acetaminophen (PERCOCET) 5-325 MG per tablet 2 tablet: 2 | ORAL | @ 19:00:00 | NDC 00406051223

## 2012-10-18 MED FILL — LOVENOX 40 MG/0.4ML SC SOLN: 40 MG/0.4ML | SUBCUTANEOUS | Qty: 0.4

## 2012-10-18 MED FILL — OMEPRAZOLE 20 MG PO CPDR: 20 MG | ORAL | Qty: 1

## 2012-10-18 MED FILL — NORMAL SALINE FLUSH 0.9 % IV SOLN: 0.9 % | INTRAVENOUS | Qty: 10

## 2012-10-18 MED FILL — OXYCODONE-ACETAMINOPHEN 5-325 MG PO TABS: 5-325 MG | ORAL | Qty: 2

## 2012-10-18 MED FILL — HYDROCHLOROTHIAZIDE 12.5 MG PO CAPS: 12.5 MG | ORAL | Qty: 2

## 2012-10-18 MED FILL — OYST-CAL-D 500 500-200 MG-UNIT PO TABS: 500-200 MG-UNIT | ORAL | Qty: 1

## 2012-10-18 MED FILL — OXYBUTYNIN CHLORIDE ER 10 MG PO TB24: 10 MG | ORAL | Qty: 1

## 2012-10-18 MED FILL — METOPROLOL SUCCINATE ER 25 MG PO TB24: 25 MG | ORAL | Qty: 1

## 2012-10-18 MED FILL — TERBINAFINE HCL 250 MG PO TABS: 250 MG | ORAL | Qty: 1

## 2012-10-18 MED FILL — CIPROFLOXACIN IN D5W 400 MG/200ML IV SOLN: 400 MG/200ML | INTRAVENOUS | Qty: 200

## 2012-10-18 NOTE — Progress Notes (Signed)
Pt up in chair.  No complaints at this time.  C/o pain 3/10.  No nausea noted denies sob.  Assessment complete.  Needs attended.  Call light within reach.  Will continue to monitor.

## 2012-10-18 NOTE — Telephone Encounter (Signed)
I spoke with Trula OreChristina advised her Dr. Berneda RoseMoran already called in Cipro. She understood.

## 2012-10-18 NOTE — Plan of Care (Signed)
Problem: Body Temperature - Risk of, Imbalanced  Goal: Body temperature within specified parameters  Outcome: Completed Date Met:  10/18/12  Patient afebrile. No signs of infection.    Electronically signed by Lorin Glass, RN on 10/18/2012 at 8:19 AM        Problem: Deep Venous Thrombosis - Risk of  Goal: Absence of deep venous thrombosis  No signs of DVT noted.    Electronically signed by Lorin Glass, RN on 10/18/2012 at 8:20 AM      Goal: Absence of impaired coagulation signs and symptoms  Outcome: Ongoing  Patient displaying no signs of DVT. No bruising noted.    ,esign

## 2012-10-18 NOTE — Progress Notes (Signed)
Physical Therapy  Daily Treatment Note    EMRI SAMPLE   10/18/12 1400   Restrictions/Precautions   Restrictions/Precautions Weight Bearing   Lower Extremity Weight Bearing Restrictions   Right Lower Extremity Weight Bearing Weight Bearing As Tolerated   Position Activity Restriction   Hip Precautions No hip flexion > 90 degrees;No ADduction;No hip internal rotation   Pain Screening   Patient Currently in Pain Denies   Transfers   Sit to Stand Stand by assistance   Stand to sit Stand by assistance   Ambulation   Ambulation? Yes   WB Status WBAT   Ambulation 1   Surface level tile   Device Engineer, manufacturing Stand by assistance   Quality of Gait no LOB noted; slow steady steps    Distance 100 feet   Stairs/Curb   Stairs? No  (pt reports she feel comfortable with curb step)   Exercises   Comments Performed R LE ex per posterior hip protocol   Short term goals   Time Frame for Short term goals 10-18-12 all goals remain current; pt will be followed by home PT   Short term goal 1 bed mobility with S/I.   Short term goal 2 transfers with S/MI.   Short term goal 3 ambulate >100' with RW and S/MI.   Short term goal 4 negotiate at least 4 steps with 1-2 HRs and SBA/S.   Short term goal 5 independently perform HEP for R THR.   Long term goals   Time Frame for Long term goals  TBD as needed.   Assessment   Assessment Decreased functional mobility    Assessment Progressing as expected   Requires PT Follow Up No   Time In 1400   Timed Code Treatment Minutes 40 Minutes   Total Treatment Time 40   Activity Tolerance   Activity Tolerance Patient Tolerated treatment well   PT D/C Equipment   Equipment Needed No   Progress Note   See Progress Note Yes   License and Documentation Cosign   Therapy License Number Alinah Sheard PT [phone]

## 2012-10-18 NOTE — Discharge Summary (Signed)
PATIENT NAME:                     Rebecca Horn  DATE OF BIRTH:         07/09/1951   MEDICAL RECORD NUMBER         [id number]  SURGERY DATE:         10/18/2012  SURGEON:                 Deland Pretty    ADMISSION DIAGNOSIS: Severe osteoarthritis right hip.     DISCHARGE DIAGNOSIS: Status post right total hip arthroplasty.     HISTORY OF PRESENT ILLNESS: Rebecca Horn is a  62 y.o. female with a long-standing history of right hip osteoarthritis. Despite conservative measures, the patient had severe persistent pain. The pain affected most activities of daily living. Due to this fact, the patient was ultimately cleared and scheduled for a total hip arthroplasty.     HOSPITAL COURSE: Right total hip arthroplasty was   performed by myself. The patient tolerated the procedure quite well. Post-operatively the patient???s diet was advanced as tolerated.Postoperatively the patient's home prescription medications were resumed. The patient was allowed to weight bear as tolerated. Posterior hip precautions were explained to the patient by myself and the therapists. The abduction pillow was discontinued once the patient understood the precautions. The patient remained neurovascularly intact in the lower extremity and had intact pulses distally.  Patient???s calves remained soft and showed no evidence of DVT. The patient was able to move their leg and ankle/foot without any problems post-operatively.  Physical therapy and occupational therapy were consulted and began working with the patient post-operatively. The patient progressed with PT/OT as would be expected and continued to improve through their stay. Initially the patient had excellent pain control with IV narcotics. We then transitioned the patient to p.o. pain medication, and the patient continued to progress rather well. The patient was started on Lovenox for DVT prophylaxis on postoperative day 1. The patient was continued on appropriate antibiotics per SCIP  measures. The Foley catheter was discontinued within 48 hours. The dressing was changed on postoperative day 2, and the wound was clean and dry. The patient did   develop a mild acute blood loss anemia and iron sulfate twice a day was started. From a medical standpoint the patient remained stable and continued to have the medicine team follow throughout their stay. The patient was ready for discharge on postoperative day number 2. The patient was discharged to Home. The patient will continue total hip rehab and will continue to follow the total hip precautions outlined to them by myself and PT/OT.    The staples were to be removed 10 days postoperatively and Steri-Strips were to be applied. The patient was to follow up with Dr. Berneda Rose in 2 to 3 weeks. The patient was to continue the Lovenox as directed. Patient was instructed on the use of pain medications, the signs and symptoms of infection, and was given our number to call should they have any questions or concerns following discharge.    Condition on Discharge: Stable      Electronically signed by Deland Pretty, MD on 10/18/2012 at 8:12 AM

## 2012-10-18 NOTE — Progress Notes (Signed)
Delivered the requested 4-pc Hip Kit to patient this date.    Thank you for the referral.

## 2012-10-18 NOTE — Telephone Encounter (Signed)
Pls call RN Trula Ore at [facility] at [phone]- pt is being discharged now and needs order for Lovanox and needs to gice Dr Berneda Rose U/A results

## 2012-10-18 NOTE — Progress Notes (Signed)
Report received. Assisted pt up to chair. C/o pain 4/10.  No other complaints at this time. Needs attended.  Call light within reach.  Will monitor.  Pt alert and oriented.

## 2012-10-18 NOTE — Progress Notes (Signed)
REC'D DC ORDER FOR PT. MET WITH PT TO CONFIRM [facility] ON DC; PT CONFIRMED AS LONG AS IN NETWORK  WITH [employer]. SW SPOKE WITH BOBBIE AT [facility] WHO STATED THEY ARE IN NETWORK. REF FAXED.   Electronically signed by Elvin So. Pistor on 10/18/2012 at 11:04 AM

## 2012-10-18 NOTE — Progress Notes (Signed)
Occupational Therapy/D/C Summary  Daily Treatment Note  Date: 10/18/2012    Patient Name: Rebecca Horn  UUV:[id number]     DOB: [birth year]     Restrictions  Restrictions/Precautions  Restrictions/Precautions: Fall Risk;Weight Bearing  Lower Extremity Weight Bearing Restrictions  Right Lower Extremity Weight Bearing: Weight Bearing As Tolerated  Position Activity Restriction  Hip Precautions: No hip flexion > 90 degrees;No ADduction;No hip internal rotation  Subjective   General  Chart Reviewed: Yes  Additional Pertinent Hx: R THA,posteriolateal approach ,L knee pain,WBAT  Family / Caregiver Present: No  Referring Practitioner: Dr Berneda Rose  Diagnosis: R THA   Pain Screening  Patient Currently in Pain: Yes  Pain Assessment: 0-10  Pain Level: 4  Pain Type: Surgical pain  Pain Location: Hip  Pain Orientation: Right  Pain Intervention(s):  (Pain medicine taken prior to session)  Multiple Pain Sites: No   Orientation  Orientation  Overall Orientation Status: Within Normal Limits  Objective    ADL  Feeding: Independent  Grooming: Supervision (standing at sink)  UE Bathing: Minimal assistance  LE Bathing:  (Issused long sponge)  UE Dressing: Minimal assistance  LE Dressing: Stand by assistance (demo use of equipment)  Toileting: Stand by assistance  Transfer: Supervision  Additional Comments: Patient to purchase 4 piece hip kit from [employer]   Balance  Sitting Balance: Independent  Standing Balance: Supervision  Standing Balance  Time: 1:00  Activity: Standing at sink for grooming and hand washing  Toilet Transfers  Toilet - Technique: Ambulating  Equipment Used: Raised toilet seat with rails (comfort height commode)  Transfer Level: Supervision  Cognition  Overall Cognitive Status: WNL   Perception  Overall Perceptual Status: WFL   Assessment   Assessment  Assessment: Decreased functional mobility ;Decreased ADL status  Rehab Potential: Good  Recommendations: Home with assist as needed;Home OT  Requires OT Follow  Up: Yes  Time In: 0930  Time Out: 1010  Timed Code Treatment Minutes: 40 Minutes  Total Treatment Time: 40  Response to Treatment  Response to Treatment: Patient Tolerated treatment well  Safety Devices  Safety Devices in place: Yes  Type of devices: Left in chair;Call light within reach  Plan   Plan  Times per week: 3-5  Times per day: Daily  Patient Education: Ed pt in Role of OT,POC, Posterior hip precautions, availability of equipment for LE ADLs.walker safety. Pt verbalized understanding of all.  Short term goals  Time Frame for Short term goals: by D/C  Short term goal 1: MinA bed mobility-GOAL MET use of leg lifter  Short term goal 2: S/ set up UB ADLs-GOAL MET  Short term goal 3: Min A LB ADLs-GOAL METG  Short term goal 4:  Maintain Posterior hip precautions in all above activities.-GOAL MET  Short term goal 5: return demo functional use of LE ADL equipment w/cues.-GOAL MET  Long term goals  Time Frame for Long term goals : TBD next care level  [facility]	[place] and Documentation Cosign  Therapy License Number: Henrine Screws [id number]

## 2012-10-18 NOTE — Progress Notes (Signed)
Department of Orthopedic Surgery    Progress Note        Subjective:  No complaints.  Doing well postoperatively.. pain is perceived as mild (1-3  pain scale)      Vitals  VITALS:  BP 117/67   Pulse 75   Temp(Src) 98.3 ??F (36.8 ??C) (Oral)   Resp 18   Ht 5\' 2"  (1.575 m)   Wt 222 lb (100.699 kg)   BMI 40.59 kg/m2   SpO2 97%    PHYSICAL EXAM:    Orientation:  alert and oriented to person, place and time    Right Lower Extremity    Incision:  dressing in place, clean, dry and intact    Lower Extremity Motor :    Moving lower extremities without difficulty today.  Able to dorsiflex and plantar flex foot/ankle.       Lower Extremity Sensory:   Neurovascularly intact to gross sensation and touch in lower extremities.  Pulses:    present 2+ bilaterally lower extremities.    Abnormal Exam findings:  none    Brace: Intact and will continue to wear during inpatient stay.    LABS:    HgB:    Lab Results   Component Value Date    HGB 9.8 10/18/2012     INR:    No components found with this basename: PTPATIENT, PTINR     CBC:   Lab Results   Component Value Date    WBC 7.7 10/11/2012    RBC 4.29 10/11/2012    HGB 9.8 10/18/2012    HCT 29.2 10/18/2012    MCV 93.9 10/11/2012    MCH 31.5 10/11/2012    MCHC 33.5 10/11/2012    RDW 14.4 10/11/2012    PLT 223 10/11/2012    MPV 9.0 10/11/2012       ASSESSMENT AND PLAN:    Post operative day 2 status post right total hip arthroplasty.    1:  Weight bearing as tolerated  2:  Continue Deep venous thrombosis prophylaxis  3:  Continue physical therapy  4:  D/C Plan:  Home Health today.  5:  Continue Pain Control   6:  UTI- will obtain repeat UA. Home on PO Cipro.

## 2012-10-18 NOTE — Telephone Encounter (Signed)
I called and spoke with the nurse, she stated the prescriptions for Lovenox were already called in. I told her I would find out if the patient needed to be sent home with script for Cipro or not. I text dr. Berneda RoseMoran regarding this.

## 2012-10-18 NOTE — Progress Notes (Signed)
Changed right hip dressing cleansed with alcohol and covered with 4x4 and tegaderm per md's post day 2 orders.  Pt tolerated well no complaints will continue to monitor.  Needs attended.  Call light within reach.  Pt up in chair

## 2012-10-18 NOTE — Progress Notes (Signed)
Physical Therapy  Progress Note  This note serves as patient discharge summary if pt discharges prior to next PT visit     10/18/12 1100   Lower Extremity Weight Bearing Restrictions   Right Lower Extremity Weight Bearing Weight Bearing As Tolerated   Position Activity Restriction   Hip Precautions No hip flexion > 90 degrees;No ADduction;No hip internal rotation   General   Chart Reviewed Yes   Response To Previous Treatment Patient with no complaints from previous session.   Family / Caregiver Present No   Referring Practitioner Dr. Berneda Rose   Pain Screening   Patient Currently in Pain Yes   Pain Assessment   Pain Assessment 0-10   Pain Level 3   Pain Type Surgical pain   Pain Location Hip   Pain Orientation Right   Transfers   Sit to Stand Supervision   Stand to sit Supervision   Ambulation   WB Status WBAT   Ambulation 1   Surface level tile   Device Rolling Walker   Assistance Stand by assistance   Quality of Gait Slow but fluid, step through pattern. Wide BOS. Mod valgus L Knee     Distance 100'   Stairs/Curb   Stairs? Yes   Stairs   # Steps  4   Rails Bilateral   Curbs 6"   Device Rolling walker   Assistance Contact guard assistance  (CGA, Ascending backwards)   Exercises   Quad Sets x10   Heelslides x5 AA   Gluteal Sets x10   Knee Long Arc Quad x10   Knee Short Arc Quad x10   Ankle Pumps x30   Comments Adductor Sets x10. Demonstrates ability to follow Written THR Ex program provided.  Able to verbalize THR positional precautions with Occ cues   Short term goals   Time Frame for Short term goals 10-18-12 All goals met, except bed mobility not observed.    Short term goal 1 bed mobility with S/I.   Short term goal 2 transfers with S/MI.   Short term goal 3 ambulate >100' with RW and S/MI.   Short term goal 4 negotiate at least 4 steps with 1-2 HRs and SBA/S.   Short term goal 5 independently perform HEP for R THR.   Assessment   Specific instructions for Next Treatment progress gait, exs, stairs   No Skilled PT  (Continue with Home PT at DC)   Requires PT Follow Up No   Time In 1030   Time Out 1100   Activity Tolerance   Activity Tolerance Patient Tolerated treatment well   PT D/C Equipment   Equipment Needed No   Progress Note   See Progress Note Yes   PT Whiteboard Notes   Therapy Whiteboard 1/7 T,W       Electronically signed by Rafael Bihari, PT [phone] on 10/18/2012 at 2:46 PM

## 2012-10-18 NOTE — Discharge Instructions (Signed)
DISCHARGE INSTRUCTIONS FOR TOTAL JOINT REPLACEMENT  Activity:    Elevate your leg if swelling occurs in your ankle.  Use elastic wraps/hose until swelling decreases.    Continue the exercise program as prescribed by physical therapists.    Take frequent walks.    Use walker, crutches, or cane with weight bearing instructions as indicated by the physical therapists.    Take rest periods often.  Elevate leg during rest period.  Hip Replacement Only:    Avoid sitting in low chairs or toilets without raised seats.    Keep knees apart.  Sleep with a pillow between your legs.    Do not cross your legs, especially when putting on shoes and socks.  Wound Care:    Cover the wound with a sterile gauze dressing and change daily as long as there is drainage.    Stitches/Staples will be removed 10 days post op.    Steri strips (white tape) may be placed over your incision after the staples have been removed.  If they have not fallen off in seven days, you may remove them.    Shower with a Tegaderm dressing until the staples are removed.    Do not scrub wound.  Pat it dry with a soft towel.    Don't apply any lotions or creams to your wound.    Check the incision every day for redness, swelling, or increase in drainage.  Diet:    You can resume your normal diet.  There are no limits on your diet due to your surgery.    Pain pills and activity changes may lead to constipation.  To prevent this, use prune juice or bran cereals liberally.  You may need to use a laxative such as Dulcolax, Senokot, or Milk of Magnesia.    Drink plenty of fluids.  Medications:    Take pain pills as ordered to maintain comfort.    Never drive while taking pain medicine.    Avoid over the counter medications until checking with your doctor.    Resume previous medications as instructed by your doctor.    Take the Lovenox as directed and until completed.  Call Your Doctor If:    You have increased pain not controlled by medications.    Excessive swelling  in your ankle.    You develop numbness, tingling, or decreased movement.    You have a fever greater than 100 degrees for a day or over 101 degrees at any one time.    Your wound becomes more reddened, starts draining, or opens.    If you fall.  You have any questions about your recovery.    Inform your family doctor/dentist or any other doctor who cares for you in the future that you have a joint replacement.  They may want to order antibiotics for dental or surgical procedures.    If you have required the use of insulin to control your blood sugar after surgery, follow up with your family doctor.    Call your surgeon's office to schedule your appointment to be seen 2 to 3 weeks after surgery.     Make your appointment to continue physical therapy per doctor's orders.    Smoking cessation assistance can be obtained from your family doctor or by calling [place] [facility]uit Line @ [phone]    _______________________________   _____   _______________________  ____                Patient Signature                Date      Witness                               Date    Enoxaparin   En Espaol (Spanish Version)   Last modified: 12/25/2009   The following information is an educational aid only. It is not intended as medical advice for individual conditions or treatments. Talk to your doctor, nurse or pharmacist before following any medical regimen to see if it is safe and effective for you.   Pronunciation   (ee noks a PA rin)   Pharmacologic Category   Low Molecular Weight Heparin   U.S. Brand Names   Lovenox   [place] Brand Names   Enoxaparin Injection, Lovenox, Lovenox HP   [place] Brand Names   Clexane   What key warnings should I know about before taking this medicine?    People who have had recent spinal anesthesia, epidurals, or spinal taps are more likely to have bleeding problems when started on this medicine. This bleeding rarely happens, but can be severe. Tell healthcare provider if you have had any spinal procedures.  Do not take any other blood-thinner medicines including nonsteroidal anti-inflammatory agents.     Tell healthcare provider you use this medicine before you receive spinal anesthesia or a spinal procedure.     This medicine is not recommended in pregnant women with heart valve replacements.    Reasons not to take this medicine    If you have an allergy to enoxaparin or any other part of this medicine.     If you are allergic to pork products, talk with healthcare provider.     Tell healthcare provider if you are allergic to any medicine. Make sure to tell about the allergy and how it affected you. This includes telling about rash; hives; itching; shortness of breath; wheezing; cough; swelling of face, lips, tongue, or throat; or any other symptoms involved.     If you have any of the following conditions: Bleeding problems or low platelet count during previous use.     If you know that you will not take the medicine as directed.     If you are pregnant and have a heart valve replacement.    What is this medicine used for?    This medicine is used to thin the blood so that clots will not form.     This medicine is used to treat blood clots.     This medicine is used to decrease heart attacks in patients who have unstable angina or mild heart attacks.    How does it work?   Enoxaparin changes the body's clotting system. It thins the blood to prevent clots from forming.   How is it best taken?    Use prescription as directed, even if feeling better.     To gain the most benefit, do not miss doses.     This medicine is given as a shot into the fatty part of the skin on the right or left side of the stomach.     Your healthcare provider may teach you how to give the shot.     Wash hands before and after use.     Move site where you give the shot with each shot.     If using prefilled syringe, do not get rid of air bubble from syringe before giving.     Throw away needles in  needle/sharp disposal box  and return box to healthcare provider when full.    What do I do if I miss a dose? (does not apply to patients in the hospital)    Take a missed dose as soon as possible.     If it is almost time for the next dose, skip the missed dose and return to your regular schedule.     Do not take a double dose or extra doses.     Do not change dose or stop medicine. Talk with healthcare provider.    What are the precautions when taking this medicine?    Wear disease medical alert identification.     If you are 64 or older, use this medicine with caution. You could have more side effects.     If you have kidney disease, talk with healthcare provider.     Tell dentists, surgeons, and other healthcare providers that you use this medicine.     You may bleed more easily. Be careful. Avoid injury. Use soft toothbrush, electric razor.     Check medicines with healthcare provider. This medicine may not mix well with other medicines.     Talk with healthcare provider before using other: aspirin, aspirin-containing products, blood thinners, garlic, ginseng, ginkgo, ibuprofen or like products, pain medicines, or vitamin E.     Use caution if you weigh less than 100 pounds.     Use caution to prevent injury and avoid falls or accidents.     Tell healthcare provider if you are pregnant or plan on getting pregnant.     Tell healthcare provider if you are breast-feeding.    What are some possible side effects of this medicine?    Bleeding problems.     Irritation where the shot is given.    What should I monitor?    Change in condition being treated. Is it better, worse, or about the same?     Signs or symptoms of bleeding.     Follow up with healthcare provider.    Reasons to call healthcare provider immediately    If you suspect an overdose, call your local poison control center or emergency department immediately.     Signs of a life-threatening reaction. These include wheezing; chest tightness; fever; itching; bad  cough; blue skin color; fits; or swelling of face, lips, tongue, or throat.     Fast heartbeat.     Severe dizziness or passing out.     Falls or accidents, especially if you hit your head. Talk with healthcare provider even if you feel fine.     Swelling or pain of leg or arm.     Significant change in thinking clearly and logically.     Severe nausea or vomiting.     Severe headache.     Weakness, numbness, or tingling.     Unusual bruising or bleeding.     Any rash.     No improvement in condition or feeling worse.    How should I store this medicine?   Store at room temperature.   General statements    If you have a life-threatening allergy, wear allergy identification at all times.     Do not share your medicine with others and do not take anyone else's medicine.     Keep all medicine out of the reach of children and pets.     Most medicines can be thrown away in household trash after mixing with coffee grounds or kitty  litter and sealing in a plastic bag.     In [place] return any unused drugs back to the pharmacy. Also, visit http://www.hc-sc.gc.ca/hl-vs/iyh-vsv/med/disposal-defaire-eng.php#th for more facts about the right way to get rid of unused drugs.     Keep a list of all your medicines (prescription, natural products, supplements, vitamins, over-the-counter) with you. Give this list to healthcare provider (doctor, nurse, nurse practitioner, pharmacist, physician assistant).     Call your doctor for medical advice about side effects. You may report side effects to FDA at 1-800-FDA-1088 or in [place] to [employer]'s [place] Vigilance Program at 8788143515.     Talk with healthcare provider before starting any new medicine, including over-the-counter, natural products, or vitamins.    Please be aware that this information is provided to supplement the care provided by your physician. It is neither intended nor implied to be a substitute for professional medical advice. CALL YOUR  HEALTHCARE PROVIDER IMMEDIATELY IF YOU THINK YOU MAY HAVE A MEDICAL EMERGENCY. Always seek the advice of your physician or other qualified health provider prior to starting any new treatment or with any questions you may have regarding a medical condition.   Last modified: 12/25/2009

## 2012-10-18 NOTE — Progress Notes (Signed)
Sent ua per mds orders.

## 2012-10-22 NOTE — Telephone Encounter (Signed)
Pt had hip sx last week with Dr Berneda Rose and has a question about blood pressure medication and dosage- pls call pt to discuss

## 2012-10-22 NOTE — Telephone Encounter (Signed)
[employer] Zimmer calling from PT, patient had a r hip replacement , posterior approach  Wants to know if they are able to do Hip Abduction.  Pls call Kathlene NovemberMike at [phone]

## 2012-10-22 NOTE — Telephone Encounter (Signed)
10/16/12 right THA. Please advise.

## 2012-10-22 NOTE — Telephone Encounter (Signed)
I spoke with the patient, she wanted to know how she should take her BP medication. I advised her to take the Hydrochlorothiazide once a day , how she was taking it before surgery ( Per Dr Scarlette CalicoMoran;s orders.) She understood.

## 2012-10-24 NOTE — Telephone Encounter (Signed)
I called and spoke with Rebecca Horn, advised him Dr. Berneda RoseMoran always does hip abduction after THA unless told otherwise. He stated he would make note of that.

## 2012-10-29 MED ORDER — OXYCODONE-ACETAMINOPHEN 5-325 MG PO TABS
5-325 MG | ORAL_TABLET | Freq: Three times a day (TID) | ORAL | Status: DC | PRN
Start: 2012-10-29 — End: 2012-10-31

## 2012-10-29 NOTE — Telephone Encounter (Signed)
Ok for [employer]ercocet Per Dr. Berneda RoseMoran. Patient advised [facility].

## 2012-10-29 NOTE — Telephone Encounter (Signed)
Pt requesting refill of Percocet 5/325 mg 50 qty  pls call pt to pick up thanks   Pt has follow up on 11/01/11

## 2012-10-31 MED ORDER — DICLOFENAC SODIUM 75 MG PO TBEC
75 MG | ORAL_TABLET | Freq: Two times a day (BID) | ORAL | Status: DC
Start: 2012-10-31 — End: 2012-12-23

## 2012-10-31 NOTE — Progress Notes (Signed)
Rebecca Horn  [id number]  October 31, 2012    Chief Complaint   Patient presents with   ??? Post-Op Check     rt THA 10-16-12              History:[name] is here in follow up regarding her right hip. She is now approximately 2weeks out from right total hip arthroplasty. She is having moderate pain. She reports minimal swelling. She is ambulating with walker. She is taking Percocet for pain. She is doing therapy at home.     Resp 16   Ht 5\' 2"  (1.575 m)   Wt 230 lb (104.327 kg)   BMI 42.06 kg/m2    Physical: [name] appears well, she is in no apparent distress, she demonstrates appropriate mood & affect. She has mild swelling. There is No evidence of DVT seen on physical exam.. She is neurovascularly intact distally. The incision is  clean, dry and intact and without erythema. Range of motion is: 40 degrees abduction, 90 degrees flexion, 35 degrees internal rotation and 35 degrees external rotation. She has no pain with range of motion of the hip.  Leg length discrepancy:none.    X-Rays: AP pelvis and lateral of the right hip was obtained and reviewed. The prosthesis is well aligned. There is no evidence of loosening or subsidence.    Impression: status post right Total Hip Arthroplasty, Doing well postoperatively.      Plan: At this time, the patient will continue physical therapy. The patient will continue posterior hip precautions. Follow up will be in 4 week(s). Prescription for Diclofenac was given. She will need a left total knee.

## 2012-11-02 NOTE — Telephone Encounter (Signed)
Patient looking for out patient PT facilities.  Wants to know if Dr Berneda RoseMoran has a list of possible PT choices for her.  She had hip sx on 10/16/12  Pls call patient at [phone]

## 2012-11-02 NOTE — Telephone Encounter (Signed)
I spoke with the patient and told [facility] her and she could choose between those 2. I advised her once she chose a facility to call me back and I will fax her PT order over.

## 2012-11-12 ENCOUNTER — Telehealth

## 2012-11-12 NOTE — Telephone Encounter (Signed)
pls view previous msg- [facility] calling to get physical therapy order faxed to them- pls fax to [phone]

## 2012-11-12 NOTE — Telephone Encounter (Signed)
[facility] What instructions?

## 2012-11-12 NOTE — Telephone Encounter (Signed)
Per Dr Berneda RoseMoran Posterior hip precautions. General strengthening and ambulation. WBAT. 2-3 times a week for 4 weeks.

## 2012-11-13 NOTE — Telephone Encounter (Signed)
PT order faxed.

## 2012-11-28 NOTE — Patient Instructions (Signed)
Impression:   1. Status post right Total Hip Arthroplasty,   Doing well postoperatively.   2. Left knee osteoarthritis   Plan: At this time, the patient will be scheduled for a left total knee arthroplasty. All risks including but not limited to blood loss, infection, persistent pain,stiffness, weakness,loosening,deep vein thrombosis,neurovascular injury, and the risks of anesthesia were discussed. The patient understands all risks and benefits of the procedure and agrees to proceed. The patient will see her primary care physician,Melissa Koleen Distance, MD, for medical clearance. If the patient is on NSAIDS or blood thinners these medications will need to be discontinued one week prior to surgery.

## 2012-11-28 NOTE — Progress Notes (Signed)
Rebecca Horn  [id number]  November 28, 2012    Chief Complaint   Patient presents with   ??? Follow-up     Right hip           History:[name] is here in follow up regarding her right hip. She is now approximately 6weeks out from right total hip arthroplasty. She is having no pain in the right hip. She reports minimal swelling. She is ambulating with cane due to her knee. She is having severe left knee pain. She is taking Tylenol for pain.      Resp 16   Ht 5\' 2"  (1.575 m)   Wt 230 lb (104.327 kg)   BMI 42.06 kg/m2    Physical exam of hip: [name] appears well, she is in no apparent distress, she demonstrates appropriate mood & affect. She has no swelling. There is No evidence of DVT seen on physical exam.. She is neurovascularly intact distally. The incision is  clean, dry, intact and nontender and without erythema. Range of motion is: 40 degrees abduction, 90 degrees flexion, 35 degrees internal rotation and 35 degrees external rotation. She has no pain with range of motion of the hip.  Leg length discrepancy:none    Physical exam of knee: Examination of the left lower extremity reveals no pain with range of motion of the hip. She has generalized tenderness to palpation about the joint line of the left knee. Range of motion is from -5 degrees to 120 degrees. Strength is 4+ to 5/5 for all muscle groups about the left knee. There is patellofemoral crepitus with range of motion of the left knee. Varus and valgus stressing of the knees reveals no evidence of instability. There is a medium effusion in the left knee. Anterior drawer and Lachman are negative bilaterally.   Examination of the skin reveals no rashes, ulceration, or lesion, bilaterally in the lower extremities. Sensation to both lower extremities is grossly intact. Exam of both feet reveals pedal pulses intact and brisk cap refill. Patient is able to dorsiflex and wiggle all toes. Deep tendon reflexes of the lower extremities are  normal and symmetric. She does have a severe valgus deformity.        X-Rays: 4 views of the left knee obtained and demonstrate severe osteoarthritis with changes most severe laterally.      Impression:   1. Status post right Total Hip Arthroplasty,   Doing well postoperatively.    2. Left knee osteoarthritis    Plan:  At this time, the patient will be scheduled for a left total knee arthroplasty. All risks including but not limited to blood loss, infection, persistent pain,stiffness, weakness,loosening,deep vein thrombosis,neurovascular injury, and the risks of anesthesia were discussed. The patient understands all risks and benefits of the procedure and agrees to proceed. The patient will see her primary care physician,Melissa Koleen Distance, MD, for medical clearance.  If the patient is on NSAIDS or blood thinners these medications will need to be discontinued one week prior to surgery.

## 2012-12-06 NOTE — Communication Body (Signed)
Rebecca Horn  [id number]  November 28, 2012    Chief Complaint   Patient presents with   ??? Follow-up     Right hip           History:[name] is here in follow up regarding her right hip. She is now approximately 6weeks out from right total hip arthroplasty. She is having no pain in the right hip. She reports minimal swelling. She is ambulating with cane due to her knee. She is having severe left knee pain. She is taking Tylenol for pain.      Resp 16   Ht 5' 2" (1.575 m)   Wt 230 lb (104.327 kg)   BMI 42.06 kg/m2    Physical exam of hip: [name] appears well, she is in no apparent distress, she demonstrates appropriate mood & affect. She has no swelling. There is No evidence of DVT seen on physical exam.. She is neurovascularly intact distally. The incision is  clean, dry, intact and nontender and without erythema. Range of motion is: 40 degrees abduction, 90 degrees flexion, 35 degrees internal rotation and 35 degrees external rotation. She has no pain with range of motion of the hip.  Leg length discrepancy:none    Physical exam of knee: Examination of the left lower extremity reveals no pain with range of motion of the hip. She has generalized tenderness to palpation about the joint line of the left knee. Range of motion is from -5 degrees to 120 degrees. Strength is 4+ to 5/5 for all muscle groups about the left knee. There is patellofemoral crepitus with range of motion of the left knee. Varus and valgus stressing of the knees reveals no evidence of instability. There is a medium effusion in the left knee. Anterior drawer and Lachman are negative bilaterally.   Examination of the skin reveals no rashes, ulceration, or lesion, bilaterally in the lower extremities. Sensation to both lower extremities is grossly intact. Exam of both feet reveals pedal pulses intact and brisk cap refill. Patient is able to dorsiflex and wiggle all toes. Deep tendon reflexes of the lower extremities are  normal and symmetric. She does have a severe valgus deformity.        X-Rays: 4 views of the left knee obtained and demonstrate severe osteoarthritis with changes most severe laterally.      Impression:   1. Status post right Total Hip Arthroplasty,   Doing well postoperatively.    2. Left knee osteoarthritis    Plan:  At this time, the patient will be scheduled for a left total knee arthroplasty. All risks including but not limited to blood loss, infection, persistent pain,stiffness, weakness,loosening,deep vein thrombosis,neurovascular injury, and the risks of anesthesia were discussed. The patient understands all risks and benefits of the procedure and agrees to proceed. The patient will see her primary care physician,Melissa L Schroer, MD, for medical clearance.  If the patient is on NSAIDS or blood thinners these medications will need to be discontinued one week prior to surgery.

## 2012-12-24 MED ORDER — HYDROCHLOROTHIAZIDE 12.5 MG PO CAPS
12.5 MG | ORAL_CAPSULE | ORAL | Status: DC
Start: 2012-12-24 — End: 2013-01-18

## 2012-12-24 MED ORDER — TOVIAZ 8 MG PO TB24
8 MG | ORAL_TABLET | ORAL | Status: DC
Start: 2012-12-24 — End: 2013-04-16

## 2012-12-24 MED ORDER — DICLOFENAC SODIUM 75 MG PO TBEC
75 MG | ORAL_TABLET | ORAL | Status: DC
Start: 2012-12-24 — End: 2013-01-18

## 2013-01-02 LAB — URINALYSIS WITH REFLEX TO CULTURE
Bilirubin Urine: NEGATIVE mg/dL
Blood, Urine: NEGATIVE
Glucose, Ur: NEGATIVE mg/dL
Ketones, Urine: NEGATIVE mg/dL
Nitrite, Urine: POSITIVE — AB
Protein, UA: NEGATIVE mg/dL
Specific Gravity, UA: 1.01 (ref 1.005–1.030)
Urobilinogen, Urine: 0.2 E.U./dL (ref <2.0)
pH, UA: 6 (ref 5.0–8.0)

## 2013-01-02 LAB — EKG 12-LEAD
Atrial Rate: 75 {beats}/min
P Axis: 38 degrees
P-R Interval: 180 ms
Q-T Interval: 382 ms
QRS Duration: 92 ms
QTc Calculation (Bazett): 426 ms
R Axis: 26 degrees
T Axis: 15 degrees
Ventricular Rate: 75 {beats}/min

## 2013-01-02 LAB — BASIC METABOLIC PANEL
BUN: 21 mg/dL — ABNORMAL HIGH (ref 7–20)
CO2: 27 mmol/L (ref 21–32)
Calcium: 9.2 mg/dL (ref 8.3–10.6)
Chloride: 97 mmol/L — ABNORMAL LOW (ref 99–110)
Creatinine: 0.5 mg/dL — ABNORMAL LOW (ref 0.6–1.2)
GFR African American: >60 (ref >60)
GFR Non-African American: >60 (ref >60)
Glucose: 84 mg/dL (ref 70–99)
Potassium: 3.7 mmol/L (ref 3.5–5.1)
Sodium: 136 mmol/L (ref 136–145)

## 2013-01-02 LAB — CBC
Hematocrit: 39.9 % (ref 36.0–48.0)
Hemoglobin: 13.3 g/dL (ref 12.0–16.0)
MCH: 31.1 pg (ref 26.0–34.0)
MCHC: 33.2 g/dL (ref 31.0–36.0)
MCV: 93.5 fL (ref 80.0–100.0)
MPV: 9.4 fL (ref 5.0–10.5)
Platelets: 234 10*3/uL (ref 135–450)
RBC: 4.27 M/uL (ref 4.00–5.20)
RDW: 14 % (ref 12.4–15.4)
WBC: 9 10*3/uL (ref 4.0–11.0)

## 2013-01-02 LAB — PROTIME-INR
INR: 0.92 (ref 0.85–1.15)
Protime: 10.3 s (ref 10.0–12.8)

## 2013-01-02 LAB — MICROSCOPIC URINALYSIS: RBC, UA: NONE SEEN /HPF (ref 0–2)

## 2013-01-02 LAB — TYPE AND SCREEN
ABO/Rh: O POS
Antibody Screen: NEGATIVE

## 2013-01-02 LAB — ALBUMIN: Albumin: 4.2 g/dL (ref 3.4–5.0)

## 2013-01-02 NOTE — Consults (Signed)
01/02/13 bo met with patient in JET class today to discuss dc plans post-surgery. Pt plans to return home w/ her husband at dc. Recently had home care in January 2014 and prefers to use that same agency-[facility] ( also prefers her same PT - who was [employer]. Pt has a walker and a cane. Verified pt coverage for lovenox '40mg'$  sq daily x 8 days w/ [employer] in [place] # [phone] Pt has zero copay! Pt denies further dc needs. Will cont to follow post-surgery and assist w/ dc plans.Electronically signed by Lavonda Jumbo on 01/02/2013 at 3:45 PM

## 2013-01-08 ENCOUNTER — Encounter

## 2013-01-08 MED ORDER — METOPROLOL SUCCINATE ER 25 MG PO TB24
25 MG | ORAL_TABLET | Freq: Every day | ORAL | Status: DC
Start: 2013-01-08 — End: 2013-01-18

## 2013-01-08 NOTE — Progress Notes (Signed)
Subjective:      Patient ID: Rebecca Horn is a 62 y.o. female.    HPI Here for pre-op for L knee replacement 2/2 arthritis. Surgery for 4/8 at [facility]. No history of anesthesia complications with multiple surgeries in the past including a R hip replacement in 1/14. Denies bleeding/clotting problems with self or family. Asked to clear for Dr. Berneda Rose for left knee replacement.    Review of Systems   Constitutional: Positive for fatigue and unexpected weight change.   HENT: Positive for congestion and rhinorrhea.    Respiratory: Positive for cough and shortness of breath.    Cardiovascular: Positive for chest pain.   Musculoskeletal: Negative for arthralgias.   Neurological: Positive for light-headedness and headaches.       Objective:   Physical Exam   Constitutional: She is oriented to person, place, and time. She appears well-developed.   HENT:   Head: Normocephalic and atraumatic.   Right Ear: External ear normal.   Left Ear: External ear normal.   Mouth/Throat: Oropharynx is clear and moist. No oropharyngeal exudate.   Eyes: Conjunctivae are normal. Pupils are equal, round, and reactive to light.   Neck: Normal range of motion. Neck supple. No thyromegaly present.   Cardiovascular: Normal rate, regular rhythm and normal heart sounds.    Pulmonary/Chest: Effort normal and breath sounds normal.   Abdominal: Soft. Bowel sounds are normal.   Musculoskeletal: She exhibits no edema.   Lymphadenopathy:     She has cervical adenopathy.   Neurological: She is alert and oriented to person, place, and time.   Skin: Skin is warm and dry. There is erythema.       Assessment:      Rebecca Horn is a 62 year old female here for pre-op eval for a L knee replacement on 4/8. She is doing well, with no additional concerns today.   toprol XL 25mg  po the day before and the day of surgery        Plan:      1. Plan for L knee replacement on 01/15/13.  2. Follow up after procedure.

## 2013-01-08 NOTE — Progress Notes (Signed)
Patient understands medication purpose, goal and dosage.

## 2013-01-08 NOTE — Patient Instructions (Signed)
Patient Self-Management Goal for Chronic Condition  Goal: I will take all medications as prescribed by my doctor, and I will call the office if I am having any medication problems.  Barriers to success: none  Plan for overcoming my barriers: N/A     Confidence: 8/10  Date goal set: 01/08/2013  Date goal attained:

## 2013-01-10 ENCOUNTER — Telehealth

## 2013-01-10 NOTE — Telephone Encounter (Signed)
That's fine IF the PA is done and doesn't need recertified. My staff doesn't have 20 more minutes to spend with the ins company

## 2013-01-10 NOTE — Telephone Encounter (Signed)
Spoke with barb and she stated fax over the lexi scan and she will call central scheduling and get a pre-cert done  Ordered lexi scan  Faxed to central scheduling

## 2013-01-10 NOTE — Telephone Encounter (Signed)
Barb @ [facility] stress lab states she knows the dobutamin ECHO is ordered to clear pt for surgery, however, she is asking if can do instead a lexi scan (nuclear study)// pt's tend to tolerate it better, it's gentler, and w/ the patient's mobility in question, she is concerned// she is asking if can get order for the lexi scan instead? Barb phone # [phone] so, fax to [facility] central scheduling @ [phone]

## 2013-01-11 MED ORDER — SULFAMETHOXAZOLE-TRIMETHOPRIM 800-160 MG PO TABS
800-160 MG | ORAL_TABLET | Freq: Two times a day (BID) | ORAL | Status: AC
Start: 2013-01-11 — End: 2013-01-18

## 2013-01-11 MED ADMIN — regadenoson (LEXISCAN) injection SOLN 0.4 mg: 0.4 mg | INTRAVENOUS | @ 14:00:00 | NDC 00469650189

## 2013-01-11 MED ADMIN — technetium tetrofosmin (Tc-MYOVIEW) injection 30 milli Curie: 30 | INTRAVENOUS | @ 16:00:00 | NDC 17156002405

## 2013-01-11 MED ADMIN — technetium tetrofosmin (Tc-MYOVIEW) injection 10 milli Curie: 10 | INTRAVENOUS | @ 13:00:00 | NDC 17156002405

## 2013-01-11 MED FILL — LEXISCAN 0.4 MG/5ML IV SOLN: 0.4 MG/5ML | INTRAVENOUS | Qty: 5

## 2013-01-11 NOTE — Telephone Encounter (Signed)
Per kelly parsons from urine culture  dwp

## 2013-01-11 NOTE — Progress Notes (Signed)
Quick Note:    Message left on voicemail and patient asked to call office back. EJ,CMA  ______

## 2013-01-15 ENCOUNTER — Inpatient Hospital Stay
Admit: 2013-01-15 | Disposition: A | Discharge status: Home / Self Care | Source: Ambulatory Visit | Attending: Orthopaedic Surgery | Admitting: Orthopaedic Surgery

## 2013-01-15 LAB — URINALYSIS WITH REFLEX TO CULTURE
Bilirubin Urine: NEGATIVE mg/dL
Blood, Urine: NEGATIVE
Glucose, Ur: NEGATIVE mg/dL
Ketones, Urine: NEGATIVE mg/dL
Nitrite, Urine: NEGATIVE
Protein, UA: NEGATIVE mg/dL
Specific Gravity, UA: 1.02 (ref 1.005–1.030)
Urobilinogen, Urine: 0.2 E.U./dL (ref <2.0)
pH, UA: 6 (ref 5.0–8.0)

## 2013-01-15 LAB — BASIC METABOLIC PANEL
BUN: 22 mg/dL — ABNORMAL HIGH (ref 7–20)
CO2: 25 mmol/L (ref 21–32)
Calcium: 9.3 mg/dL (ref 8.3–10.6)
Chloride: 99 mmol/L (ref 99–110)
Creatinine: 0.6 mg/dL (ref 0.6–1.2)
GFR African American: >60 (ref >60)
GFR Non-African American: >60 (ref >60)
Glucose: 94 mg/dL (ref 70–99)
Potassium: 3.9 mmol/L (ref 3.5–5.1)
Sodium: 137 mmol/L (ref 136–145)

## 2013-01-15 LAB — HEMOGLOBIN AND HEMATOCRIT
Hematocrit: 38.5 % (ref 36.0–48.0)
Hemoglobin: 12.6 g/dL (ref 12.0–16.0)

## 2013-01-15 LAB — MICROSCOPIC URINALYSIS: RBC, UA: NONE SEEN /HPF (ref 0–2)

## 2013-01-15 LAB — PLATELET COUNT: Platelets: 249 10*3/uL (ref 135–450)

## 2013-01-15 MED ORDER — OXYCODONE-ACETAMINOPHEN 5-325 MG PO TABS
5-325 | ORAL | Status: DC | PRN
Start: 2013-01-15 — End: 2013-01-18
  Administered 2013-01-15 – 2013-01-18 (×5): 1 via ORAL

## 2013-01-15 MED ORDER — OXYCODONE-ACETAMINOPHEN 5-325 MG PO TABS
5-325 | ORAL | Status: DC | PRN
Start: 2013-01-15 — End: 2013-01-15

## 2013-01-15 MED ORDER — ONDANSETRON HCL 4 MG/2ML IJ SOLN
4 | Freq: Once | INTRAMUSCULAR | Status: DC | PRN
Start: 2013-01-15 — End: 2013-01-15

## 2013-01-15 MED ORDER — HYDROMORPHONE 0.5MG/0.5ML IJ SOLN
1 | Status: DC | PRN
Start: 2013-01-15 — End: 2013-01-18

## 2013-01-15 MED ORDER — ACETAMINOPHEN 325 MG PO TABS
325 | ORAL | Status: DC | PRN
Start: 2013-01-15 — End: 2013-01-18

## 2013-01-15 MED ORDER — FENTANYL CITRATE 0.05 MG/ML IJ SOLN
0.05 | INTRAMUSCULAR | Status: DC | PRN
Start: 2013-01-15 — End: 2013-01-15

## 2013-01-15 MED ADMIN — famotidine (PEPCID) injection 20 mg: 20 mg | INTRAVENOUS | @ 13:00:00 | NDC 00641602201

## 2013-01-15 MED ADMIN — celecoxib (CELEBREX) capsule 400 mg: ORAL | @ 13:00:00 | NDC 00025152534

## 2013-01-15 MED ADMIN — HYDROmorphone (DILAUDID) injection 0.5 mg: 0.5 mg | INTRAVENOUS | @ 17:00:00 | NDC 00409128305

## 2013-01-15 MED ADMIN — ondansetron (ZOFRAN) injection 4 mg: 4 mg | INTRAVENOUS | @ 18:00:00 | NDC 00409475503

## 2013-01-15 MED ADMIN — clindamycin (CLEOCIN) 600 mg in dextrose 5 % 50 mL IVPB: 600 mg | INTRAVENOUS | @ 21:00:00 | NDC 00781328991

## 2013-01-15 MED ADMIN — sulfamethoxazole-trimethoprim (BACTRIM DS) 800-160 MG per tablet 1 tablet: 1 | ORAL | @ 18:00:00 | NDC 68084023011

## 2013-01-15 MED ADMIN — 0.9 % sodium chloride infusion: INTRAVENOUS | @ 13:00:00 | NDC 00338004904

## 2013-01-15 MED ADMIN — clindamycin (CLEOCIN) 600 mg in dextrose 5 % 50 mL IVPB: INTRAVENOUS | @ 14:00:00 | NDC 00781328991

## 2013-01-15 MED ADMIN — 0.9 % sodium chloride infusion: INTRAVENOUS | @ 18:00:00 | NDC 00338004904

## 2013-01-15 MED FILL — GLYCOPYRROLATE 1 MG/5ML IJ SOLN: 1 MG/5ML | INTRAMUSCULAR | Qty: 5

## 2013-01-15 MED FILL — ROCURONIUM BROMIDE 50 MG/5ML IV SOLN: 50 MG/5ML | INTRAVENOUS | Qty: 5

## 2013-01-15 MED FILL — SODIUM CHLORIDE 0.9 % IV SOLN: 0.9 % | INTRAVENOUS | Qty: 1000

## 2013-01-15 MED FILL — CLEOCIN IN D5W 600 MG/50ML IV SOLN: 600 MG/50ML | INTRAVENOUS | Qty: 50

## 2013-01-15 MED FILL — LIDOCAINE HCL 2 % IJ SOLN: 2 % | INTRAMUSCULAR | Qty: 20

## 2013-01-15 MED FILL — KETOROLAC TROMETHAMINE 30 MG/ML IJ SOLN: 30 MG/ML | INTRAMUSCULAR | Qty: 1

## 2013-01-15 MED FILL — ONDANSETRON HCL 4 MG/2ML IJ SOLN: 4 MG/2ML | INTRAMUSCULAR | Qty: 2

## 2013-01-15 MED FILL — FAMOTIDINE 10 MG/ML IV SOLN: 10 MG/ML | INTRAVENOUS | Qty: 2

## 2013-01-15 MED FILL — DEXAMETHASONE SODIUM PHOSPHATE 4 MG/ML IJ SOLN: 4 MG/ML | INTRAMUSCULAR | Qty: 5

## 2013-01-15 MED FILL — HYDROMORPHONE HCL PF 1 MG/ML IJ SOLN: 1 MG/ML | INTRAMUSCULAR | Qty: 0.5

## 2013-01-15 MED FILL — NAROPIN 5 MG/ML IJ SOLN: 5 MG/ML | INTRAMUSCULAR | Qty: 90

## 2013-01-15 MED FILL — FENTANYL CITRATE 0.05 MG/ML IJ SOLN: 0.05 MG/ML | INTRAMUSCULAR | Qty: 5

## 2013-01-15 MED FILL — CELEBREX 200 MG PO CAPS: 200 MG | ORAL | Qty: 2

## 2013-01-15 MED FILL — SULFAMETHOXAZOLE-TMP DS 800-160 MG PO TABS: 800-160 MG | ORAL | Qty: 1

## 2013-01-15 MED FILL — FRESENIUS PROPOVEN 10 MG/ML IV EMUL: 10 MG/ML | INTRAVENOUS | Qty: 20

## 2013-01-15 MED FILL — OXYCODONE-ACETAMINOPHEN 5-325 MG PO TABS: 5-325 MG | ORAL | Qty: 2

## 2013-01-15 MED FILL — HYDROMORPHONE HCL PF 2 MG/ML IJ SOLN: 2 MG/ML | INTRAMUSCULAR | Qty: 1

## 2013-01-15 MED FILL — THROMBIN-JMI 20000 UNITS EX KIT: 20000 units | CUTANEOUS | Qty: 1

## 2013-01-15 MED FILL — MORPHINE SULFATE (PF) 0.5 MG/ML IJ SOLN: 0.5 MG/ML | INTRAMUSCULAR | Qty: 10

## 2013-01-15 MED FILL — HUMALOG 100 UNIT/ML SC SOLN: 100 UNIT/ML | SUBCUTANEOUS | Qty: 3

## 2013-01-15 MED FILL — SODIUM CHLORIDE 0.9 % IJ SOLN: 0.9 % | INTRAMUSCULAR | Qty: 10

## 2013-01-15 MED FILL — MIDAZOLAM HCL 2 MG/2ML IJ SOLN: 2 MG/ML | INTRAMUSCULAR | Qty: 2

## 2013-01-15 MED FILL — EPINEPHRINE HCL 1 MG/ML IJ SOLN: 1 mg/mL | INTRAMUSCULAR | Qty: 1

## 2013-01-15 MED FILL — SODIUM CHLORIDE 0.9 % IV SOLN: 0.9 % | INTRAVENOUS | Qty: 100

## 2013-01-15 NOTE — Progress Notes (Signed)
Pt with cpm on lt leg. tol well at 50 flexion. Neuro vascular intact.Electronically signed by Brynda Peon, RN on 01/16/2013 at 1:58 AM

## 2013-01-15 NOTE — Progress Notes (Signed)
To pacu reacting sleepy pink warm and dry in sr drsg dc&I strong pedal pulse  Ice to lt. knee

## 2013-01-15 NOTE — Consults (Signed)
Hospital Medicine  Consult History & Physical        Chief Complaint:  Medical management    Date of Service: Pt seen/examined in consultation on 01/15/13    History Of Present Illness:  The patient is a 62 y.o. female who we are asked to see/evaluate by Dr. Berneda Rose  for peri-operative mgt of HTN, GERD, OSA .   No complaints , comfortable.  Has some belching. No CP,         Past Medical History:        Diagnosis Date   ??? Unspecified sleep apnea    ??? Hypertension    ??? Menopause    ??? Osteopenia    ??? Uterine fibroid    ??? Osteoarthritis of right hip 08/22/2012   ??? GERD (gastroesophageal reflux disease)    ??? Nausea & vomiting      slight nausea       Past Surgical History:        Procedure Laterality Date   ??? Tonsillectomy and adenoidectomy     ??? Foot surgery       had a bone spur on left foot   ??? Fracture surgery       right ankle   ??? Total hip arthroplasty Right 10/16/2012     Dr Berneda Rose       Medications Prior to Admission:    Prior to Admission medications    Medication Sig Start Date End Date Taking? Authorizing Provider   sulfamethoxazole-trimethoprim (BACTRIM DS) 800-160 MG per tablet Take 1 tablet by mouth 2 times daily for 7 days. 01/11/13 01/18/13 Yes Melissa L Schroer, MD   metoprolol (TOPROL XL) 25 MG XL tablet Take 1 tablet by mouth daily. 01/08/13  Yes Kelly Splinter, MD   TOVIAZ 8 MG TB24 TAKE ONE TABLET BY MOUTH EVERY DAY 12/23/12  Yes Melissa L Schroer, MD   hydrochlorothiazide (MICROZIDE) 12.5 MG capsule TAKE ONE CAPSULE BY MOUTH EVERY DAY 12/23/12  Yes Melissa L Schroer, MD   diclofenac (VOLTAREN) 75 MG EC tablet TAKE ONE TABLET BY MOUTH TWICE A DAY 12/23/12   Elesa Massed, MD   calcium carbonate-vitamin D (CALCIUM + D) 600-200 MG-UNIT TABS Take 1 capsule by mouth.    Historical Provider, MD   Omega-3 Fatty Acids (FISH OIL) 1000 MG CAPS Take 1,000 mg by mouth daily.    Historical Provider, MD   Fesoterodine Fumarate ER (TOVIAZ) 8 MG TB24 Take 8 mg by mouth Daily. 02/01/12 12/23/12  Kelly Splinter, MD             Allergies:  Penicillins    Social History:      TOBACCO:   reports that she has never smoked. She has never used smokeless tobacco.  ETOH:   reports that  drinks alcohol.      Family History:  Reviewed in detail and negative for DM, Early CAD, Cancer, CVA. Positive as follows:    No family history on file.    REVIEW OF SYSTEMS:   Positive for heart burn and as noted in the HPI.  All other systems reviewed and negative.    PHYSICAL EXAM:  BP 104/67   Pulse 57   Temp(Src) 97.4 ??F (36.3 ??C) (Oral)   Resp 18   Ht 5\' 3"  (1.6 m)   Wt 230 lb (104.327 kg)   BMI 40.75 kg/m2   SpO2 94%    General appearance: No apparent distress, appears stated age and cooperative.  HEENT Normal  cephalic, atraumatic without obvious deformity. Pupils equal, round, and reactive to light.  Extra ocular muscles intact. Conjunctivae/corneas clear.  Neck: Supple, No jugular venous distention/bruits. Trachea midline without thyromegaly or adenopathy with full range of motion.  Lungs: Clear to ascultation, bilaterally without Rales/Wheezes/Rhonchi with good respiratory effort.  Heart: Regular rate and rhythm with Normal S1/S2 without  murmurs, rubs or gallops, point of maximum impulse non-displaced  Abdomen: Soft, non-tender or non-distended without rigidity or guarding and positive bowel sounds all four quadrants.  Extremities: No clubbing, cyanosis, or edema bilaterally.ACE bandage +  Skin: Skin color, texture, turgor normal.  No rashes or lesions.  Neurologic: Alert and oriented X 3,  neurovascularly intact with sensory/motor intact upper extremities/lower extremities, bilaterally. Cranial nerves:II-XII intact, grossly non-focal.  Mental status: Alert, oriented, thought content appropriate.        CBC   Recent Labs      01/15/13   1315   HGB  12.6   HCT  38.5   PLT  249      RENAL  Recent Labs      01/15/13   0845   NA  137   K  3.9   CL  99   CO2  25   BUN  22*   CREATININE  0.6     LFT'S  No results found for this basename: AST, ALT, ALB,  BILIDIR, BILITOT, ALKPHOS,  in the last 72 hours  COAG  No results found for this basename: INR,  in the last 72 hours  CARDIAC ENZYMES  No results found for this basename: CKTOTAL, CKMB, CKMBINDEX, TROPONINI,  in the last 72 hours    U/A:    Lab Results   Component Value Date    COLORU Yellow 01/15/2013    WBCUA 3-5 01/15/2013    RBCUA None seen 01/15/2013    BACTERIA 4+ 01/02/2013    CLARITYU Clear 01/15/2013    SPECGRAV 1.020 01/15/2013    LEUKOCYTESUR SMALL 01/15/2013    BLOODU Negative 01/15/2013    GLUCOSEU Negative 01/15/2013    AMORPHOUS 1+ 01/15/2013       ABG    No results found for this basename: HCO3ART, BEART, O2SATART, PHART, THGBART, PCO2ART, PO2ART, TCO2ART           There are no hospital problems to display for this patient.      ASSESSMENT/PLAN:  1. Knee replacement  -as per primary team    2.HTN  - controlled  - put holding parameters for hypotension    3. GERD  -ct meds    4. OSA  - patient does not want CPAP today, ct with O2          Diet: General  Code Status: Full Code      Dispo - as per primary team    Than you for the consult. We will continue to follow     Jim Desanctis, MD

## 2013-01-15 NOTE — Anesthesia Pre-Procedure Evaluation (Signed)
[facility] Department of Anesthesiology  Pre-Anesthesia Evaluation/Consultation       Name:  Rebecca Horn                                         Age:  62 y.o.  MRN:  [id number]           Procedure (Scheduled):  Left TKR  Surgeon:  Dr. Berneda Rose     Allergies   Allergen Reactions   ??? Penicillins Rash     Patient Active Problem List   Diagnosis   ??? Unspecified sleep apnea   ??? HYPERTENSION   ??? Menopause   ??? Osteopenia   ??? Uterine fibroid   ??? Onychomycosis   ??? Osteoarthritis of right hip   ??? Osteoarthritis of right hip   ??? S/P total hip arthroplasty     Past Medical History   Diagnosis Date   ??? Unspecified sleep apnea    ??? Hypertension    ??? Menopause    ??? Osteopenia    ??? Uterine fibroid    ??? Osteoarthritis of right hip 08/22/2012   ??? GERD (gastroesophageal reflux disease)    ??? Nausea & vomiting      slight nausea     Past Surgical History   Procedure Laterality Date   ??? Tonsillectomy and adenoidectomy     ??? Foot surgery       had a bone spur on left foot   ??? Fracture surgery       right ankle   ??? Total hip arthroplasty Right 10/16/2012     Dr Berneda Rose     History   Substance Use Topics   ??? Smoking status: Never Smoker    ??? Smokeless tobacco: Never Used   ??? Alcohol Use: Yes      Comment: occassionally     Medications  No current facility-administered medications on file prior to encounter.     Current Outpatient Prescriptions on File Prior to Encounter   Medication Sig Dispense Refill   ??? sulfamethoxazole-trimethoprim (BACTRIM DS) 800-160 MG per tablet Take 1 tablet by mouth 2 times daily for 7 days.  14 tablet  0   ??? metoprolol (TOPROL XL) 25 MG XL tablet Take 1 tablet by mouth daily.  4 tablet  0   ??? TOVIAZ 8 MG TB24 TAKE ONE TABLET BY MOUTH EVERY DAY  30 tablet  2   ??? diclofenac (VOLTAREN) 75 MG EC tablet TAKE ONE TABLET BY MOUTH TWICE A DAY  60 tablet  1   ??? hydrochlorothiazide (MICROZIDE) 12.5 MG capsule TAKE ONE CAPSULE BY MOUTH EVERY DAY  30 capsule  0   ??? calcium carbonate-vitamin D (CALCIUM +  D) 600-200 MG-UNIT TABS Take 1 capsule by mouth.       ??? Omega-3 Fatty Acids (FISH OIL) 1000 MG CAPS Take 1,000 mg by mouth daily.       ??? [DISCONTINUED] Fesoterodine Fumarate ER (TOVIAZ) 8 MG TB24 Take 8 mg by mouth Daily.  30 tablet  3     Current Facility-Administered Medications   Medication Dose Route Frequency Provider Last Rate Last Dose   ??? celecoxib (CELEBREX) capsule 400 mg  400 mg Oral Once Elesa Massed, MD       ??? clindamycin (CLEOCIN) 600 mg in dextrose 5 % 50 mL IVPB  600 mg Intravenous  Once Elesa Massed, MD       ??? 0.9 % sodium chloride infusion   Intravenous Continuous Tonie Griffith, MD       ??? sodium chloride flush 0.9 % injection 10 mL  10 mL Intravenous Q12H [facility] Tonie Griffith, MD       ??? sodium chloride flush 0.9 % injection 10 mL  10 mL Intravenous PRN Tonie Griffith, MD       ??? famotidine (PEPCID) injection 20 mg  20 mg Intravenous Once Tonie Griffith, MD         Vital Signs (Current)   There were no vitals filed for this visit.  Vital Signs Statistics (for past 48 hrs)     No Data Recorded    BP Readings from Last 3 Encounters:   01/08/13 122/78   10/18/12 96/63   10/09/12 130/84     BMI  There is no weight on file to calculate BMI.  Estimated body mass index is 42.24 kg/(m^2) as calculated from the following:    Height as of 01/08/13: 5\' 2"  (1.575 m).    Weight as of 01/08/13: 231 lb (104.781 kg).    CBC   Lab Results   Component Value Date    WBC 9.0 01/02/2013    RBC 4.27 01/02/2013    HGB 13.3 01/02/2013    HCT 39.9 01/02/2013    MCV 93.5 01/02/2013    RDW 14.0 01/02/2013    PLT 234 01/02/2013     CMP    Lab Results   Component Value Date    NA 136 01/02/2013    K 3.7 01/02/2013    CL 97 01/02/2013    CO2 27 01/02/2013    BUN 21 01/02/2013    CREATININE 0.5 01/02/2013    GFRAA >60 01/02/2013    GFRAA >60 02/01/2012    AGRATIO 2.0 10/09/2012    LABGLOM >60 01/02/2013    LABGLOM >60 08/17/2009    GLUCOSE 84 01/02/2013    PROT 6.2 10/09/2012    CALCIUM 9.2 01/02/2013     BILITOT 0.2 10/09/2012    ALKPHOS 67 10/09/2012    AST 19 10/09/2012    ALT 18 10/09/2012     BMP    Lab Results   Component Value Date    NA 136 01/02/2013    K 3.7 01/02/2013    CL 97 01/02/2013    CO2 27 01/02/2013    BUN 21 01/02/2013    CREATININE 0.5 01/02/2013    CALCIUM 9.2 01/02/2013    GFRAA >60 01/02/2013    GFRAA >60 02/01/2012    LABGLOM >60 01/02/2013    LABGLOM >60 08/17/2009    GLUCOSE 84 01/02/2013     POCGlucose  No results found for this basename: GLUCOSE,  in the last 72 hours   Coags    Lab Results   Component Value Date    PROTIME 10.3 01/02/2013    INR 0.92 01/02/2013    APTT 30.0 10/11/2012     HCG (If Applicable)   No results found for this basename: PREGTESTUR,  PREGSERUM,  HCG,  HCGQUANT      ABGs   No results found for this basename: PHART,  PO2ART,  PCO2ART,  HCO3ART,  BEART,  O2SATART      Type & Screen (If Applicable)  No results found for this basename: LABABO,  [facility]          Anesthesia Evaluation  Patient summary reviewed and Nursing notes reviewed    History of anesthetic complications (ponv/motion- pt states no PONV with surgery 1/14)   Airway   Mallampati: III  TM distance: <3 FB  Neck ROM: full  Dental      Pulmonary     breath sounds clear to auscultation  (+) sleep apnea on CPAP ,   (-) COPD, asthma, shortness of breath, recent URI  Cardiovascular   (+) hypertension (couple yrs),   (-) pacemaker, valvular problems/murmurs, past MI, CAD, CABG/stent, dysrhythmias, CHF    ECG reviewed  Rhythm: regular  Rate: normal  ROS comment: Stress negative 4/14  Beta Blocker:  Dose within 24 Hrs    Neuro/Psych    (-) seizures, neuromuscular disease, TIA, CVA, headaches, psychiatric history  GI/Hepatic/Renal    (+) GERD poorly controlled,   (-) hiatal hernia, PUD, hepatitis, liver disease, renal disease    Endo/Other  (+) , arthritis  (-) no type I diabetes, no type II diabetes, hypothyroidism, hyperthyroidism, blood dyscrasia  Abdominal   (+) obese,   Abdomen: soft.                    Allergies:  Penicillins    NPO Status:                              Anesthesia Plan    ASA 3     general     intravenous induction   Anesthetic plan and risks discussed with patient.    Plan discussed with CRNA.      DOS STAFF ADDENDUM:    Pt seen and examined, chart reviewed (including anesthesia, drug and allergy history).  No interval changes to history and physical examination.  Anesthetic plan, risks, benefits, alternatives, and personnel involved discussed with patient.  Patient verbalized an understanding and agrees to proceed.      Alden Hipp, MD  January 15, 2013  8:12 AM      Alden Hipp  01/15/2013

## 2013-01-15 NOTE — Progress Notes (Signed)
Dr Luna Glasgow notified of pt co pain at 8 and a half will check pt.

## 2013-01-15 NOTE — Progress Notes (Signed)
Report to RN  For rom 3125 pt sleeping woke pt states pain states pain 8 and is tolerable to room per bed drsg clean

## 2013-01-15 NOTE — Anesthesia Post-Procedure Evaluation (Signed)
[facility] Department of Anesthesiology  Post-Anesthesia Note       Name:  Rebecca Horn                                         Age:  62 y.o.  MRN:  [id number]     Last Vitals & Oxygen Saturation: BP 128/65   Pulse 62   Temp(Src) 97 ??F (36.1 ??C) (Temporal)   Resp 8   Ht 5\' 3"  (1.6 m)   Wt 230 lb (104.327 kg)   BMI 40.75 kg/m2   SpO2 97%  Patient Vitals for the past 4 hrs:   BP Temp Pulse Resp SpO2   01/15/13 1245 128/65 mmHg - 62 - 97 %   01/15/13 1240 132/47 mmHg 97 ??F (36.1 ??C) 62 8 99 %       Level of consciousness: awake, alert  and oriented    Respiratory: stable     Cardiovascular: stable     Hydration: stable     PONV: stable     Post-op pain: Adequate analgesia    Post-op assessment: no apparent anesthetic complications, tolerated procedure well and no evidence of recall    Complications:  none    Dajiah Kooi Wilhelmina Mcardle, MD  January 15, 2013   12:55 PM

## 2013-01-15 NOTE — Progress Notes (Signed)
Co 9 pain rx given lt. Knee xrays done

## 2013-01-15 NOTE — Op Note (Signed)
PATIENT NAME:                     Rebecca Horn  DATE OF BIRTH:         04-20-1951   MEDICAL RECORD NUMBER         [id number]  SURGERY DATE:         01/15/2013  SURGEON:                 Deland Pretty      PREOPERATIVE DIAGNOSIS:left knee osteoarthritis.     POSTOPERATIVE DIAGNOSIS:left knee osteoarthritis.     PROCEDURE: left total knee arthroplasty.     SURGEON: Venida Jarvis, MD.     ANESTHESIA: General endotracheal anesthesia.     IV FLUIDS: Crystalloid.    ESTIMATED BLOOD LOSS: 300 ml       COMPLICATIONS: None. The patient tolerated the procedure quite well.     COMPONENTS: A Zimmer size E  LPS flex femur, size 3  tibial tray, size   29 patellar button, and a size 14 ultra-high molecular weight polyethylene   spacer.     INDICATIONS: The patient is a 62 y.o. female with a longstanding history   of left  knee pain.Pain: Night pain: yes . She failed all conservative measures including injections, PT and NSAIDs . X-rays confirmed severe osteoarthritis. Due to this fact, the patient was ultimately cleared and scheduled for left total knee arthroplasty.     DESCRIPTION OF PROCEDURE: The patient was identified preoperatively. The proper extremity was marked. The patient was then taken to the operating room and general endotracheal anesthesia was administered by Anesthesia. Appropriate preoperative antibiotics were administered. A Foley catheter was inserted preoperatively.   Nonsterile tourniquet was applied to the left thigh. The left lower extremity was then chloraprepped in the standard fashion. We then draped out in standard fashion. We sealed off the skin with the Ioban. We then   elevated the tourniquet to 300 mmHg. We then made a standard anterior longitudinal midline incision. We incised skin and coagulated all bleeders with Bovie electrocautery. We dissected down and did perform a medial parapatellar arthrotomy in standard fashion. We did not perform a medial release due to the valgus Left deformity. We then  excised   the fat pad. We flexed up the knee and everted the patella. The femoral   canal was opened with the starter drill. We then attached our 5-degree IM   alignment rod. We then attached our distal femoral cutting block and made   our distal femoral cut without difficulty. The femur was then sized to a E size . We pinned for 3 degrees of external rotation. We then attached our   4-in-1 cutting guide and made our femoral cuts without difficulty. All soft   tissues were protected. We then attached our box cutting guide and cut our   box for our PS components. This worked out extremely well. We then removed   the remainder of the ACL and the PCL. We excised the remainder of the medial   and lateral meniscus. We then attached our extramedullary tibial cutting   guide and made our tibial cuts. We made a nice flat cut on the tibia. We   elected to take off approximately 9-10  mm medially and approximately 2 mm laterally. We made a nice flat cut. This worked out extremely well. We then trialed the knee with our  spacer, and the knee was symmetrically   balanced with  our size  14 spacer. To obtain balance the popliteus was released and the lateral collateral was gently released.There was no liftoff in flexion. The knee did get out to full extension. We then went ahead and measured our patellar thickness. We were ready for finishing our patella.   We reamed down to the appropriate depth. We then drilled our peg holes in   superomedial fashion for our size 32 patella. The patella did track rather   well. We removed all trial components. We sized our tibial tray to a 3 size . We made sure to externally rotate the tibial tray towards the medial one-third of the tibial tubercle. We then finished the tibia. We reamed for the stem.   We then broached. We then irrigated the bone and the wound rather   copiously. We then plugged the femur with a bone plug. We then were ready   for cementing. We first cemented the tibia. We then  cemented the femur. We   then went ahead and cemented the patella. We allowed the cement to harden   with the trial spacer. We again trialed the knee and the knee was   symmetrically balanced. We then removed all excess cement. We then went   ahead and snapped in our actual ultra-high molecular weight PS   spacer. We let down tourniquet and maintained hemostasis with Bovie   electrocautery. We then irrigated the knee rather copiously. We then   sprayed the articular surface in the joint with the thrombin spray. We then   injected the capsule and subcutaneous tissues with the orthomix. We then   were ready for closure.   We closed our arthrotomy with interrupted figure-of-eight #1 Vicryl stitches.   We then closed the subcutaneous tissues with interrupted 2-0 simple Vicryl   stitches. We then closed the skin with staples. Sterile dressings were   applied. There were no complications.

## 2013-01-15 NOTE — Progress Notes (Signed)
rx given for pain states 9 drsg clean circ checks good

## 2013-01-15 NOTE — Progress Notes (Signed)
Eyes closed resting quietly

## 2013-01-15 NOTE — H&P (Signed)
The patient was interviewed and examined and there have been no changes since the documented History and Physical.    Electronically signed by Deland Pretty, MD on 01/15/2013 at 1:07 PM

## 2013-01-15 NOTE — Plan of Care (Signed)
1.  Identify name and date of birth.  2.  Patient remains free from signs and symptoms of injury.  3.  Patient receives appropriate medication(s), safely administered during the       Perioperative period.  4.  The patient is free from signs and symptoms of infection.  5.  The patient has wound/tissur perfusion.  6.  The patient's fluid, electrolyte, and acid-base balances are established perioperatively.  7.  The patient's pulmonary function is established preoperatively.  8.  The patient's cardiovascular status is established perioperatively.  9.  The patient/caregiver demonstrates knowledge of nutritional management related to the operative or other invasive procedure.  10.  The patient/caregiver demonstrates knowledge of medication management.  11.  The patient/caregiver demonstrates knowledge of pain management.  12.  The patient participates in the rehabilitation process as applicable.  13.  The patient/caregiver participates in decisions in decisions affecting his or her Perioperative plan of care.  14.  The patients care is consistent with the individualized Perioperative plan of care.  15.  The patients right to privacy is maintained.  16.  The patient is the recipient of competent and ethical care within legal standards of practice.  17.  The patient's value system, lifestyle, ethnicity, and culture are considered, respected, and incorporated int the perioperative plan of care and understands special services available.  18.  The patient demonstrates and/or reports adequate pain control throughout the perioperative period.  19.  The patient's neurological status is established preoperatively.  20.  The patient/caregiver demonstrates knowledge of the expected responses to the operative or invasive procedure.  21.  Patient/caregiver has reduced anxiety.  Interventions - Familiarize with environment and equipment.  22.  Patient/caregiver verbalizes understanding of Phase I and Phase II process.  23.  Patient  pain level is established preoperatively using age appropriate pain scale.

## 2013-01-15 NOTE — Progress Notes (Signed)
Pt admitted and oriented to room, including call light and menu. Pt arrived with ted and pnuemos applied, bed alarm and fall bracelet applied. Bed locked, side rails up and brakes applied. Admitting assessment and vitals complete. Pt IV infusing, currently denies pain. Pt is A/O, vitals presently stable, admitting questions complete. Electronically signed by Tylene Fantasia. Grieshop, RN on 01/15/2013 at 1:40 PM

## 2013-01-15 NOTE — Plan of Care (Signed)
Problem: Falls - Risk of  Goal: Absence of falls  Outcome: Ongoing  No falls noted this shift. Patient ambulates with x1 staff assistance without difficulty. Family member at bedside. Bed kept in low position. Safe environment maintained. Bedside table & call light in reach. Uses call light appropriately when needing assistance. Electronically signed by Tylene Fantasia. Grieshop, RN on 01/15/2013 at 2:51 PM          Problem: PAIN  Goal: Patient???s pain/discomfort is manageable  Outcome: Ongoing  Pain /discomfort being managed with PRN analgesics per MD orders. Patient able to express presence and absence of pain and rate pain appropriately using numerical scale.   Electronically signed by Tylene Fantasia. Grieshop, RN on 01/15/2013 at 2:51 PM        Problem: SKIN INTEGRITY  Goal: Skin integrity is maintained or improved  Outcome: Ongoing  Pt able to turn self. Encouraged to turn Q2 hours. Skin assessment complete.  Electronically signed by Tylene Fantasia. Grieshop, RN on 01/15/2013 at 2:51 PM        Problem: Deep Venous Thrombosis - Risk of  Goal: Absence of deep venous thrombosis  Outcome: Ongoing  Assessed for presence of DVT pt presently negative for s/s. Electronically signed by Tylene Fantasia. Grieshop, RN on 01/15/2013 at 2:52 PM      Goal: Absence of impaired coagulation signs and symptoms  Outcome: Ongoing  Pt presently absent for s/s of impaired coagulation. Displays good ROM with assessment in L. Ankle and foot, limited movement in knee r/t surgery. Electronically signed by Tylene Fantasia. Grieshop, RN on 01/15/2013 at 2:52 PM        Problem: Infection - Risk of, Surgical Site Infection  Goal: Able to perform proper infection control measures  Outcome: Ongoing  Pt assessed for infection, No signs or symptoms of surgical site noted. VVS, WBC WNL. Reviewed information with pt and family, pt verbalized understanding   Electronically signed by Tylene Fantasia. Grieshop, RN on 01/15/2013 at 2:53 PM      Goal: Absence of infection signs and symptoms  Outcome:  Ongoing  Pt assessed for infection, No signs or symptoms of surgical site noted. VVS, WBC WNL. Reviewed information with pt and family, pt verbalized understanding   Electronically signed by Tylene Fantasia. Grieshop, RN on 01/15/2013 at 2:53 PM        Problem: Nausea/Vomiting  Goal: Able to drink  Outcome: Ongoing  Pt presently tolerating PO fluids, IV fluids infusing per MD order. Electronically signed by Tylene Fantasia. Grieshop, RN on 01/15/2013 at 2:54 PM      Goal: Absence of nausea  Outcome: Ongoing  Pt presently states small amount of nausea, received medication per MD order to manage. Will continue to monitor. Electronically signed by Tylene Fantasia. Grieshop, RN on 01/15/2013 at 2:54 PM        Problem: Discharge Planning  Goal: Able to perform ADL  Outcome: Ongoing  Pt presently able to feed self and adjust position independently. Pt post-op day one, has not yet been up with PT. Electronically signed by Tylene Fantasia. Grieshop, RN on 01/15/2013 at 2:55 PM      Goal: Able to tolerate oral medications  Outcome: Ongoing  Pt presently tolerating all PO fluids and snack from floor. Electronically signed by Tylene Fantasia. Grieshop, RN on 01/15/2013 at 2:56 PM      Goal: Adequate nutritional intake  Outcome: Ongoing  Pt denies any unplanned weight loss, pt present intake is acceptable. Electronically signed by Tylene Fantasia. Synthia Innocent, RN  on 01/15/2013 at 2:56 PM      Goal: Participation in care planning  Outcome: Ongoing  Care plan reviewed with patient and family.  Patient and family verbalize understanding of the plan of care and contribute to goal setting.

## 2013-01-15 NOTE — Progress Notes (Signed)
Incentive Spirometry education and demonstration given by Respiratory Therapy.  Minimum Predicted Vital Capacity is 1950 mL.  Pt achieving 1750 mL at time of instruction.

## 2013-01-15 NOTE — Progress Notes (Signed)
Lab here drawing H&H resting quietly joking with nurse and lab tech

## 2013-01-16 LAB — BASIC METABOLIC PANEL
BUN: 19 mg/dL (ref 7–20)
CO2: 23 mmol/L (ref 21–32)
Calcium: 7.9 mg/dL — ABNORMAL LOW (ref 8.3–10.6)
Chloride: 98 mmol/L — ABNORMAL LOW (ref 99–110)
Creatinine: 0.6 mg/dL (ref 0.6–1.2)
GFR African American: >60 (ref >60)
GFR Non-African American: >60 (ref >60)
Glucose: 111 mg/dL — ABNORMAL HIGH (ref 70–99)
Potassium: 4.1 mmol/L (ref 3.5–5.1)
Sodium: 131 mmol/L — ABNORMAL LOW (ref 136–145)

## 2013-01-16 LAB — POCT GLUCOSE
POC Glucose: 147 mg/dl — ABNORMAL HIGH (ref 70–99)
POC Glucose: 110 mg/dl — ABNORMAL HIGH (ref 70–99)
POC Glucose: 134 mg/dl — ABNORMAL HIGH (ref 70–99)
POC Glucose: 116 mg/dl — ABNORMAL HIGH (ref 70–99)

## 2013-01-16 LAB — HEMOGLOBIN AND HEMATOCRIT
Hematocrit: 30.8 % — ABNORMAL LOW (ref 36.0–48.0)
Hemoglobin: 10.1 g/dL — ABNORMAL LOW (ref 12.0–16.0)

## 2013-01-16 MED ADMIN — oxyCODONE-acetaminophen (PERCOCET) 5-325 MG per tablet 2 tablet: 2 | ORAL | @ 17:00:00 | NDC 00406051223

## 2013-01-16 MED ADMIN — insulin lispro (HUMALOG) injection 0-6 Units: 1 [IU] | SUBCUTANEOUS | @ 02:00:00 | NDC 00002751017

## 2013-01-16 MED ADMIN — sulfamethoxazole-trimethoprim (BACTRIM DS) 800-160 MG per tablet 1 tablet: 1 | ORAL | @ 01:00:00 | NDC 68084023011

## 2013-01-16 MED ADMIN — 0.9 % sodium chloride infusion: INTRAVENOUS | @ 05:00:00 | NDC 00338004904

## 2013-01-16 MED ADMIN — sulfamethoxazole-trimethoprim (BACTRIM DS) 800-160 MG per tablet 1 tablet: 1 | ORAL | @ 13:00:00 | NDC 68084023011

## 2013-01-16 MED ADMIN — oxyCODONE-acetaminophen (PERCOCET) 5-325 MG per tablet 2 tablet: 2 | ORAL | @ 03:00:00 | NDC 00406051223

## 2013-01-16 MED ADMIN — ondansetron (ZOFRAN) injection 4 mg: 4 mg | INTRAVENOUS | @ 19:00:00 | NDC 00409475503

## 2013-01-16 MED ADMIN — sodium chloride flush 0.9 % injection 10 mL: 10 mL | INTRAVENOUS | @ 13:00:00

## 2013-01-16 MED ADMIN — docusate sodium (COLACE) capsule 100 mg: 100 mg | ORAL | @ 13:00:00 | NDC 63739047810

## 2013-01-16 MED ADMIN — clindamycin (CLEOCIN) 600 mg in dextrose 5 % 50 mL IVPB: 600 mg | INTRAVENOUS | @ 05:00:00 | NDC 00009337501

## 2013-01-16 MED ADMIN — enoxaparin (LOVENOX) injection 40 mg: 40 mg | SUBCUTANEOUS | @ 13:00:00 | NDC 00075801401

## 2013-01-16 MED ADMIN — oxyCODONE-acetaminophen (PERCOCET) 5-325 MG per tablet 2 tablet: 2 | ORAL | @ 21:00:00 | NDC 00406051223

## 2013-01-16 MED ADMIN — oxyCODONE-acetaminophen (PERCOCET) 5-325 MG per tablet 2 tablet: 2 | ORAL | @ 07:00:00 | NDC 00406051223

## 2013-01-16 MED ADMIN — oxybutynin (DITROPAN-XL) CR tablet 10 mg: 10 mg | ORAL | @ 01:00:00 | NDC 68084061011

## 2013-01-16 MED ADMIN — calcium-vitamin D (OSCAL-500) 500-200 MG-UNIT per tablet 1 tablet: 1 | ORAL | @ 13:00:00 | NDC 63739029101

## 2013-01-16 MED FILL — ONDANSETRON HCL 4 MG/2ML IJ SOLN: 4 MG/2ML | INTRAMUSCULAR | Qty: 2

## 2013-01-16 MED FILL — OXYBUTYNIN CHLORIDE ER 10 MG PO TB24: 10 MG | ORAL | Qty: 1

## 2013-01-16 MED FILL — OXYCODONE-ACETAMINOPHEN 5-325 MG PO TABS: 5-325 MG | ORAL | Qty: 2

## 2013-01-16 MED FILL — NORMAL SALINE FLUSH 0.9 % IV SOLN: 0.9 % | INTRAVENOUS | Qty: 10

## 2013-01-16 MED FILL — OXYCODONE-ACETAMINOPHEN 5-325 MG PO TABS: 5-325 MG | ORAL | Qty: 1

## 2013-01-16 MED FILL — SULFAMETHOXAZOLE-TMP DS 800-160 MG PO TABS: 800-160 MG | ORAL | Qty: 1

## 2013-01-16 MED FILL — LOVENOX 40 MG/0.4ML SC SOLN: 40 MG/0.4ML | SUBCUTANEOUS | Qty: 0.4

## 2013-01-16 MED FILL — DOK 100 MG PO CAPS: 100 MG | ORAL | Qty: 1

## 2013-01-16 MED FILL — OYST-CAL-D 500 500-200 MG-UNIT PO TABS: 500-200 MG-UNIT | ORAL | Qty: 1

## 2013-01-16 MED FILL — CLEOCIN IN D5W 600 MG/50ML IV SOLN: 600 MG/50ML | INTRAVENOUS | Qty: 50

## 2013-01-16 MED FILL — SODIUM CHLORIDE 0.9 % IV SOLN: 0.9 % | INTRAVENOUS | Qty: 1000

## 2013-01-16 NOTE — Progress Notes (Signed)
Physical Therapy  Progress Note  This note serves as patient discharge summary if pt discharges prior to next PT visit     01/16/13 1700   Restrictions/Precautions   Restrictions/Precautions Weight Bearing   Lower Extremity Weight Bearing Restrictions   Left Lower Extremity Weight Bearing Weight Bearing As Tolerated   General   Chart Reviewed Yes   Additional Pertinent Hx Elective L TKR 01-15-13 Dr. Berneda Rose.  WBAT, CPM 0-50, increase 10 degrees/day.  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   Family / Caregiver Present No   Referring Practitioner Dr. Berneda Rose   Pain Screening   Patient Currently in Pain Yes   Pain Assessment   Pain Assessment 0-10   Pain Level 6   Pain Type Surgical pain   Pain Location Knee   Pain Orientation Left   Orientation   Overall Orientation Status WNL   Transfers   Sit to Stand Contact guard assistance   Stand to sit Contact guard assistance  (cues )   Ambulation   Ambulation? Yes   WB Status WBAT L LE   Ambulation 1   Surface level tile   Device Surveyor, quantity guard assistance   Quality of Gait Interrrupted, step to.  Reports Dizzyness at end point of ambulation    Distance 15', limited by dizzyness    Balance   Comments Good Dynamic @ wh walker   Exercises   Knee Active Range of Motion Gentle AAROM 8-60   Comments Written TKR exs x 10 with cues, including gentle AAROM knee flex ext, and Skateboard knee flexion    Short term goals   Time Frame for Short term goals 1-3 days.     Short term goal 1 Client goal: to walk well enough to go home   Short term goal 2 Transfers Supervision   Short term goal 3 Gait wh walker 77' Supervision   Short term goal 4 Platform Step with walker and CGA   Short term goal 5 Indep written HEP   Assessment   Assessment Decreased functional mobility ;Decreased endurance   Treatment Diagnosis Elective L TKR 01-15-13 Dr. Berneda Rose.  WBAT, CPM 0-50, increase 10 degrees/day.  PMHx: R [facility] 10-16-12     Specific instructions for Next Treatment Progress transfers, gt, exs, platform step as tolerated    Prognosis Good   Discharge Recommendations Continue to assess pending progress;Home with assist PRN;Home PT   Requires PT Follow Up Yes   Time In 0215   Time Out 0245   Activity Tolerance   Activity Tolerance Patient limited by fatigue;Patient limited by pain  (Limited by dizzyness )   Plan   Times per week daily-BID    Current Treatment Recommendations Strengthening;ROM;Functional Mobility Teacher, music;Teaching laboratory technician;Safety Education & Training;Patient/Caregiver Education & Training;Equipment Evaluation, Education, & procurement;Stair training   Patient Education Proper positioning to maximize knee flex ext and to control edema, proper exs and activity progression    Progress Note   See Progress Note Yes   License and Documentation Cosign   Therapy License Number Guido Sander PT [id number]   PT Whiteboard Notes   Therapy Whiteboard E 4-9 W     Electronically signed by Rafael Bihari, PT [phone] on 01/16/2013 at 5:22 PM

## 2013-01-16 NOTE — Progress Notes (Signed)
Patient had a medium size emesis, Zofran administered per MD order, will continue to monitor Electronically signed by Floy Sabina, RN on 01/16/2013 at 3:18 PM  "

## 2013-01-16 NOTE — Progress Notes (Signed)
Occupational Therapy   Occupational Therapy Initial Assessment  Date: 01/16/2013   Patient Name: Rebecca Horn  MRN: [id number]     DOB: Sep 27, 1951           Restrictions  Restrictions/Precautions  Restrictions/Precautions: Weight Bearing  Lower Extremity Weight Bearing Restrictions  Left Lower Extremity Weight Bearing: Weight Bearing As Tolerated  Vision/Hearing  Vision  Vision: Within Functional Limits (reading glasses )  Hearing  Hearing: Within functional limits  Subjective   General  Chart Reviewed: Yes  Additional Pertinent Hx: Elective L TKR 01-15-13 Dr. Berneda Rose.  WBAT, CPM 0-50, increase 10 degrees/day.  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   Family / Caregiver Present: No  Referring Practitioner: Dr Berneda Rose  Subjective  Subjective: Pt c/o nausea, vomited moderate amt of emesis aftrer return to room from PT, nursing notified , provided zofran.  Pain Screening  Patient Currently in Pain: Yes  Pain Assessment: 0-10  Pain Level: 5  Pain Type: Surgical pain  Pain Location: Leg  Pain Orientation: Left  Pain Descriptors: Burning  Pain Frequency: Intermittent  Patient's Stated Pain Goal: 4  Pain Intervention(s): Medication (see eMar)     Home Living  Home Living  Lives With: Spouse  Type of Home: House  Home Layout: One level  Home Access: Stairs to enter without rails (1 STE)  Bathroom Shower/Tub: Pension scheme manager: Handicap height  Bathroom Equipment: Programmer, applications  Home Equipment: Standard walker;Rolling walker;Cane;Reacher;Sock aid;Leg lifter;Long-handled Probation officer  Overall Orientation Status: Within Normal Limits  Objective      Prior Function  ADL Assistance: Independent  Homemaking Assistance: Independent (Husband for heavy cleaning and most cooking )  Ambulation Assistance: Independent (cane for distances, no device at home )  Transfer Assistance: Independent     Balance  Sitting Balance: Supervision  Standing Balance: Contact guard assistance (c/o nausea with  movement)  ADL  Feeding: Setup;Independent  Grooming: Setup (bed side)  UE Dressing: Minimal assistance  LE Dressing: Moderate assistance  Transfer: Contact guard assistance (verbal cues)  Additional Comments: Anticipate pt able to bath w/ Min-Mod A , she has equipment needed at home after her recent THA.        Bed mobility  Rolling: Moderate assistance  Sit to Supine: Moderate assistance  Sit to Stand: Contact guard assistance  Bed to Chair: Contact guard assistance        Cognition  Overall Cognitive Status: WFL  Perception  Overall Perceptual Status: WFL  Sensation  Overall Sensation Status: WNL     ROM  ROM: WFL  LUE Strength  Gross LUE Strength: WFL  RUE Strength  Gross RUE Strength: WFL                  Assessment   Assessment  Assessment: Decreased functional mobility ;Decreased ADL status;Decreased functional activity tolerance  Rehab Potential: Good  Recommendations: Home with assist as needed;24 hr. supervision/assist  Goal Formulation: Patient  Requires OT Follow Up: Yes  Time In: 1445  Timed Code Treatment Minutes: 55 Minutes  Total Treatment Time: 70  Response to Treatment  Response to Treatment: Treatment limited secondary to medical complications (free text) (pt limited by nausea and vomiting)  Comments: Pt returned to bed ,Ice paks on knee, LE elevated w/ foot of bed propped, pt. given ice chips and encouraged to drink water. Pt resting comfortably at close of TX. Still no urge to void urine after cath removed.  Safety Devices  Safety Devices in place: Yes  Type of devices: Bed alarm in place;Call light within reach;Left in bed (ice pak on R knee)       Plan   Plan  Times per week: 3-5  Times per day: Daily  Patient Education: Ed pt. on role of OT,POC and d/c recommendations. Anticipate return home with husband's assist.         Goals  Short term goals  Time Frame for Short term goals: by d/c from acute care: Pt goal to return directly home from hospital  Short term goal 1: Bed mobility w/  MinA  Short term goal 2: ADL transfer/mobility w/ CGA/SBA  Short term goal 3: UB ADLs w/ CGA/SBA  Short term goal 4: LB ADLs w/ MinA  Short term goal 5: Toilet w/ CGA/SBA    If patient is discharged prior to next OT visit , refer to last OT progress note for Discharge Status.      Duane Boston  OT/L # [phone]

## 2013-01-16 NOTE — Progress Notes (Signed)
Removed Foley cath from patient at 0800.  There were of urine in bag.  Urine was clear, yellow/straw colored, and had no odor.

## 2013-01-16 NOTE — Progress Notes (Signed)
Progress Note  Admit Date: 01/15/2013      PCP: Kelly Splinter, MD     CC: F/U for medical management  SUBJECTIVE / Interval History:  patient Bp was running low  She felt a little dizzy  No Cp, syncope          Allergies  Penicillins    Medications    Scheduled Meds:  ??? calcium-vitamin D  1 tablet Oral Daily with breakfast   ??? sulfamethoxazole-trimethoprim  1 tablet Oral BID   ??? oxybutynin  10 mg Oral Nightly   ??? sodium chloride flush  10 mL Intravenous Q12H [facility]   ??? docusate sodium  100 mg Oral BID   ??? enoxaparin  40 mg Subcutaneous Daily   ??? insulin lispro  0-12 Units Subcutaneous TID WC   ??? insulin lispro  0-6 Units Subcutaneous Nightly     Continuous Infusions:  ??? sodium chloride 100 mL/hr at 01/16/13 0031       PRN Meds:  sodium chloride flush, acetaminophen, magnesium hydroxide, ondansetron, oxyCODONE-acetaminophen, oxyCODONE-acetaminophen, HYDROmorphone, HYDROmorphone    Vitals    TEMPERATURE:  Current - Temp: 98.5 ??F (36.9 ??C); Max - Temp  Avg: 97.8 ??F (36.6 ??C)  Min: 97 ??F (36.1 ??C)  Max: 98.5 ??F (36.9 ??C)  RESPIRATIONS RANGE: Resp  Avg: 16.5  Min: 8  Max: 18  PULSE RANGE: Pulse  Avg: 59.7  Min: 54  Max: 67  BLOOD PRESSURE RANGE:  Systolic (24hrs), Avg:109 mmHg, Min:86 mmHg, Max:132 mmHg  ; Diastolic (24hrs), Avg:61 mmHg, Min:47 mmHg, Max:70 mmHg    PULSE OXIMETRY RANGE: SpO2  Avg: 95.3 %  Min: 80 %  Max: 100 %  24HR INTAKE/OUTPUT:    Intake/Output Summary (Last 24 hours) at 01/16/13 1121  Last data filed at 01/16/13 1056   Gross per 24 hour   Intake 3218.33 ml   Output   1100 ml   Net 2118.33 ml       Exam:    Gen: No distress.   Eyes: PERRL. No sclera icterus. No conjunctival injection.   ENT: No discharge. Pharynx clear. External appearance of ears and nose normal.  Neck: Trachea midline. No obvious mass.    Resp: No accessory muscle use. No crackles. No wheezes. No rhonchi. No dullness on percussion.  CV: Regular rate. Regular rhythm. No murmur or rub. No edema.   GI: Non-tender. Non-distended. No  hernia.   Skin: Warm, dry, normal texture and turgor. No nodule on exposed extremities.   Lymph: No cervical LAD. No supraclavicular LAD.   M/S: No cyanosis. No clubbing. Ace + on L leg  Neuro: Moves all four extremities. CN 2-12 tested, no defect noted.  Psych: Oriented x 3. No anxiety.  Awake. Alert. Intact judgement and insight.    Data      CBC: Recent Labs      01/15/13   1315  01/16/13   0550   HGB  12.6  10.1*   HCT  38.5  30.8*   PLT  249   --      BMP: Recent Labs      01/15/13   0845  01/16/13   0550   NA  137  131*   K  3.9  4.1   CL  99  98*   CO2  25  23   BUN  22*  19   CREATININE  0.6  0.6     POC GLUCOSE:    Recent Labs  01/15/13   2135  01/16/13   0800   POCGLU  147*  110*     LIVER PROFILE: No results found for this basename: AST, ALT, LIPASE,  AMYLASE,  ALB, BILIDIR, BILITOT, ALKPHOS,  in the last 72 hours  PT/INR: No results found for this basename: PROTIME, INR,  in the last 72 hours  APTT: No results found for this basename: APTT,  in the last 72 hours  UA:  Recent Labs      01/15/13   0845   COLORU  Yellow   PHUR  6.0   WBCUA  3-5   RBCUA  None seen   CLARITYU  Clear   SPECGRAV  1.020   LEUKOCYTESUR  SMALL*   UROBILINOGEN  0.2   BILIRUBINUR  Negative   BLOODU  Negative   GLUCOSEU  Negative   AMORPHOUS  1+*       Consults:     IP CONSULT TO HOSPITALIST  IP CONSULT TO CASE MANAGEMENT    ASSESSMENT AND PLAN      Active Problems:    * No active hospital problems. *    1. Knee replacement   -as per primary team     2.HTN   - hypotensive sec to pain meds  - restart med's on discharge if Bp is Ok    3. GERD   -ct meds     4. OSA   - patient does not want CPAP today, ct with O2    5. Blood loss anemia  - monitor    6. Hyponatremia  -monitor            Diet: General  Code Status: Full Code    PT/OT Eval Status:noted    Discharge plan - as per primary  The patient and / or the family were informed of the results of any tests, a time was given to answer questions, a plan was proposed and they agreed  with plan.    Jim Desanctis, MD

## 2013-01-16 NOTE — Progress Notes (Signed)
At 1100, patient stated she needed to void and she wanted to ambulate to the bathroom rather than use the bedside commode.  She ambulated to the bathroom commode and back with minimal assistance.

## 2013-01-16 NOTE — Progress Notes (Signed)
Referral per clinical path; Met with patient. She will return to home where she lives with her husband who will be home to help her. She is agreeable to home care through [facility] as per jet class plans. She is aware of xarelto coverage and has a walker for home use. She denies any other needs or concerns at present.   Electronically signed by Watt Climes on 01/16/2013 at 5:17 PM  [phone]

## 2013-01-16 NOTE — Plan of Care (Signed)
Problem: Falls - Risk of  Goal: Absence of falls  Outcome: Ongoing  Fall risk assessment completed .Fall precautions in place, bed/ chair alarm on, side rails 2/4 up, call light in reach, educated pt on calling for assistance when needed, room clear of clutter. Pt verbalized understanding. Electronically signed by Floy Sabina, RN on 01/16/2013 at 2:58 PM        Problem: PAIN  Goal: Patient???s pain/discomfort is manageable  Outcome: Ongoing  Pain assessed at beginning of shift and PRN throughout the day, educated patient on PRN pain analgesics and non-pharmacological pain techniques, reassess pain after administration of analgesics, patient satisfied with pain level Electronically signed by Floy Sabina, RN on 01/16/2013 at 2:58 PM        Problem: Deep Venous Thrombosis - Risk of  Goal: Absence of deep venous thrombosis  Outcome: Ongoing  Patient denies calf pain or tenderness, no signs of redness or swelling in BLE, patient denies SOB, SCDs intact, TED hose on, encouraging patient to ambulate Electronically signed by Floy Sabina, RN on 01/16/2013 at 2:59 PM        Problem: Infection - Risk of, Surgical Site Infection  Goal: Absence of infection signs and symptoms  Outcome: Ongoing  Patient is afibrile, no redness or drainage at incision site Electronically signed by Floy Sabina, RN on 01/16/2013 at 2:59 PM

## 2013-01-16 NOTE — Progress Notes (Signed)
Department of Orthopedic Surgery    Progress Note        Subjective:  No complaints.  Doing well postoperatively.. pain is perceived as mild (1-3  pain scale)      Vitals  VITALS:  BP 86/52   Pulse 66   Temp(Src) 98.3 ??F (36.8 ??C) (Oral)   Resp 18   Ht 5\' 3"  (1.6 m)   Wt 230 lb (104.327 kg)   BMI 40.75 kg/m2   SpO2 96%  24HR INTAKE/OUTPUT:    Intake/Output Summary (Last 24 hours) at 01/16/13 0731  Last data filed at 01/15/13 2311   Gross per 24 hour   Intake 2553.33 ml   Output    700 ml   Net 1853.33 ml       PHYSICAL EXAM:    Orientation:  alert and oriented to person, place and time    Left Lower Extremity    Incision:  dressing in place, clean, dry and intact    Lower Extremity Motor :    Moving lower extremities without difficulty today.  Able to dorsiflex and plantar flex foot/ankle.       Lower Extremity Sensory:   Neurovascularly intact to gross sensation and touch in lower extremities.  Pulses:    present 2+ bilaterally lower extremities.    Abnormal Exam findings:  none    Brace: Intact and will continue to wear during inpatient stay.    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    1:  Weight bearing as tolerated  2:  Continue Deep venous thrombosis prophylaxis  3:  Continue physical therapy  4:  D/C Plan:  Home Health  5:  Continue Pain Control   6:  Acute blood loss anemia - expected after surgery. Will monitor Hgb

## 2013-01-16 NOTE — Progress Notes (Signed)
Physical Therapy    Initial Assessment  This note serves as patient discharge summary if pt discharges prior to next PT visit      Date: 01/16/2013  Patient Name: Rebecca Horn  MRN: [id number]    DOB: [birth year]    Treatment Diagnosis: Elective L TKR 01-15-13 Dr. Berneda Rose.  WBAT, CPM 0-50, increase 10 degrees/day.  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 Berneda Rose.  WBAT, CPM 0-50, increase 10 degrees/day.  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 Berneda Rose  Referral Date : 01/15/13  Diagnosis: Impaired functional mobility due to L TKR 4-14   Follows Commands: Within Functional Limits  Pain Screening  Patient Currently in Pain: Yes  Pain Assessment  Pain Assessment: 0-10  Pain Level: 4  Pain Type: Surgical pain  Pain Location: Leg  Pain Orientation: Left  Orientation  Orientation  Overall Orientation Status: Within Normal Limits  Home Living  Home Living  Lives With: Spouse  Type of Home: House  Home Layout: One level  Home Access: Stairs to enter without rails (1 STE)  Bathroom Shower/Tub: Pension scheme manager: Handicap height  Bathroom Equipment: Programmer, applications  Home Equipment: Standard walker;Rolling walker;Cane;Reacher;Sock aid;Leg lifter;Long-handled shoehorn  Objective  Prior Function  ADL Assistance: Independent  Homemaking Assistance: Independent (Husband for heavy cleaning and most cooking )  Ambulation Assistance: Independent (cane for distances, no device at home )  Transfer Assistance: Independent  AROM RLE (degrees)  RLE  AROM: WFL  AROM LLE (degrees)  LLE AROM:  (5-60 )  Strength RLE  Comment: grossly 3/5   Strength LLE  Strength LLE: WFL  Sensation  Overall Sensation Status: WNL  Bed Mobility  Supine to Sit: Contact guard assistance  Transfers  Sit to Stand: Contact guard assistance  Stand to sit: Contact guard assistance (cues )  Ambulation  Ambulation?: Yes  WB Status: WBAT L LE  Ambulation 1  Surface: level tile  Device: Rolling Walker  Assistance: Contact guard assistance  Quality of Gait: Interrrupted, step to.  Reports min-mod nausea by end of amb  Distance: Around bed to chair, 20'  Balance  Comments: Good Dynamic @ wh walker  Exercises  Comments: Initial Written TKR exs x 10 with cues   Assessment   Assessment  Assessment: Decreased functional mobility ;Decreased endurance  Treatment Diagnosis: Elective L TKR 01-15-13 Dr. Berneda Rose.  WBAT, CPM 0-50, increase 10 degrees/day.  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   Specific instructions for Next Treatment: Progress transfers, gt, exs, platform step as tolerated   Prognosis: Good  Discharge Recommendations: Continue to assess pending progress;Home with assist PRN;Home PT  Requires PT Follow Up: Yes  Time In: 0815  Time Out: 0905  Activity Tolerance  Activity Tolerance: Patient Tolerated treatment well;Patient limited by fatigue       Plan   Plan  Times per week: daily-BID   Current Treatment Recommendations: Strengthening;ROM;Functional Mobility Teacher, music;Teaching laboratory technician;Safety Education & Training;Patient/Caregiver Education & Training;Equipment Evaluation, Education, & Web designer Devices in place: Yes  Type of devices: All fall risk precautions in place;Call light within reach;Chair alarm in place;Gait belt;Left in chair;Nurse notified  Goals  Short term goals  Time Frame for Short term goals: 1-3 days.    Short term goal 1: Client goal: to walk well enough to go home  Short term goal 2: Transfers Supervision  Short term goal 3: Gait wh walker  102' Supervision  Short term goal 4: Platform Step with walker and CGA  Short term goal 5: Indep written HEP       Electronically signed by Rafael Bihari, PT [phone] on 01/16/2013 at 9:11 AM

## 2013-01-17 LAB — HEMOGLOBIN AND HEMATOCRIT
Hematocrit: 27.9 % — ABNORMAL LOW (ref 36.0–48.0)
Hemoglobin: 9.1 g/dL — ABNORMAL LOW (ref 12.0–16.0)

## 2013-01-17 LAB — POCT GLUCOSE
POC Glucose: 130 mg/dl — ABNORMAL HIGH (ref 70–99)
POC Glucose: 92 mg/dl (ref 70–99)
POC Glucose: 86 mg/dl (ref 70–99)
POC Glucose: 115 mg/dl — ABNORMAL HIGH (ref 70–99)

## 2013-01-17 MED ADMIN — docusate sodium (COLACE) capsule 100 mg: 100 mg | ORAL | @ 02:00:00 | NDC 63739047810

## 2013-01-17 MED ADMIN — docusate sodium (COLACE) capsule 100 mg: 100 mg | ORAL | @ 14:00:00 | NDC 63739047810

## 2013-01-17 MED ADMIN — oxybutynin (DITROPAN-XL) CR tablet 10 mg: 10 mg | ORAL | @ 02:00:00 | NDC 68084061011

## 2013-01-17 MED ADMIN — ferrous sulfate EC tablet 324 mg: 324 mg | ORAL | @ 14:00:00 | NDC 00574060810

## 2013-01-17 MED ADMIN — sodium chloride flush 0.9 % injection 10 mL: 10 mL | INTRAVENOUS | @ 14:00:00

## 2013-01-17 MED ADMIN — sodium chloride flush 0.9 % injection 10 mL: 10 mL | INTRAVENOUS | @ 02:00:00

## 2013-01-17 MED ADMIN — enoxaparin (LOVENOX) injection 40 mg: 40 mg | SUBCUTANEOUS | @ 14:00:00 | NDC 00075801401

## 2013-01-17 MED ADMIN — sulfamethoxazole-trimethoprim (BACTRIM DS) 800-160 MG per tablet 1 tablet: 1 | ORAL | @ 14:00:00 | NDC 68084023011

## 2013-01-17 MED ADMIN — ferrous sulfate EC tablet 324 mg: 324 mg | ORAL | @ 21:00:00 | NDC 00574060810

## 2013-01-17 MED ADMIN — sulfamethoxazole-trimethoprim (BACTRIM DS) 800-160 MG per tablet 1 tablet: 1 | ORAL | @ 02:00:00 | NDC 68084023011

## 2013-01-17 MED ADMIN — calcium-vitamin D (OSCAL-500) 500-200 MG-UNIT per tablet 1 tablet: 1 | ORAL | @ 14:00:00 | NDC 00182443910

## 2013-01-17 MED ADMIN — oxyCODONE-acetaminophen (PERCOCET) 5-325 MG per tablet 2 tablet: 2 | ORAL | @ 09:00:00 | NDC 00406051223

## 2013-01-17 MED ADMIN — oxyCODONE-acetaminophen (PERCOCET) 5-325 MG per tablet 2 tablet: 2 | ORAL | @ 19:00:00 | NDC 00406051223

## 2013-01-17 MED ADMIN — oxyCODONE-acetaminophen (PERCOCET) 5-325 MG per tablet 2 tablet: 2 | ORAL | @ 02:00:00 | NDC 00406051223

## 2013-01-17 MED FILL — NORMAL SALINE FLUSH 0.9 % IV SOLN: 0.9 % | INTRAVENOUS | Qty: 10

## 2013-01-17 MED FILL — LOVENOX 40 MG/0.4ML SC SOLN: 40 MG/0.4ML | SUBCUTANEOUS | Qty: 0.4

## 2013-01-17 MED FILL — OXYCODONE-ACETAMINOPHEN 5-325 MG PO TABS: 5-325 MG | ORAL | Qty: 2

## 2013-01-17 MED FILL — DOK 100 MG PO CAPS: 100 MG | ORAL | Qty: 1

## 2013-01-17 MED FILL — SULFAMETHOXAZOLE-TMP DS 800-160 MG PO TABS: 800-160 MG | ORAL | Qty: 1

## 2013-01-17 MED FILL — FERROUS SULFATE 324 (65 FE) MG PO TBEC: 324 (65 Fe) MG | ORAL | Qty: 1

## 2013-01-17 MED FILL — OXYBUTYNIN CHLORIDE ER 10 MG PO TB24: 10 MG | ORAL | Qty: 1

## 2013-01-17 MED FILL — OYST-CAL-D 500 500-200 MG-UNIT PO TABS: 500-200 MG-UNIT | ORAL | Qty: 1

## 2013-01-17 NOTE — Progress Notes (Signed)
Moderate pain.  Hgb 9.1. Acute blood loss anemia - expected after surgery. Will monitor Hgb.  Home tomorrow.

## 2013-01-17 NOTE — Progress Notes (Signed)
Physical Therapy  Progress Note  This note serves as patient discharge summary if pt discharges prior to next PT visit  Rebecca Horn       01/17/13    Restrictions/Precautions   Restrictions/Precautions Weight Bearing   Lower Extremity Weight Bearing Restrictions   Left Lower Extremity Weight Bearing Weight Bearing As Tolerated   General   Chart Reviewed Yes   Additional Pertinent Hx Elective L TKR 01-15-13 Dr. Berneda Rose.  WBAT, CPM 0-50, increase 10 degrees/day.  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   Family / Caregiver Present No   Referring Practitioner Dr. Berneda Rose   Pain Screening   Patient Currently in Pain Yes   Pain Assessment   Pain Assessment 0-10   Pain Level 3   Pain Type Surgical pain   Pain Location Knee   Pain Orientation Left   Orientation   Overall Orientation Status WNL   Transfers   Sit to Stand Modified Independent    Stand to sit Modified Independent    Ambulation   Ambulation? Yes   WB Status WBAT L LE   Ambulation 1   Surface level tile   Device Rolling Walker   Assistance Stand by assistance     Quality of Gait Interrrupted, step to.     Distance  STAIRS 70', limited by dizzyness      Balance   Comments Good Dynamic @ wh walker   Exercises   Knee Active Range of Motion Gentle AAROM 5-70   Comments Written TKR exs x 10 with cues, including gentle AAROM knee flex ext, and Skateboard knee flexion.  Client instructed in calf/HS stretch   Short term goals   Time Frame for Short term goals 1-3 days.     Short term goal 1 Client goal: to walk well enough to go home   Short term goal 2 Transfers Supervision 01-17-13: Goal met   Short term goal 3 Gait wh walker 43' Supervision   Short term goal 4 Platform Step with walker and CGA. 01-17-13: goal met   Short term goal 5 Indep written HEP   Assessment   Assessment Decreased functional mobility ;Decreased endurance   Treatment Diagnosis Elective L TKR 01-15-13 Dr. Berneda Rose.  WBAT, CPM 0-50, increase  10 degrees/day.  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    Specific instructions for Next Treatment Progress transfers, gt, exs, platform step as tolerated    Prognosis Good   Discharge Recommendations Home with assist PRN;Home PT   Requires PT Follow Up Yes   Time In 2:10   Time Out 2:40   Activity Tolerance   Activity Tolerance Tolerated well     Plan   Times per week daily-BID    Current Treatment Recommendations Strengthening;ROM;Functional Mobility Teacher, music;Teaching laboratory technician;Safety Education & Training;Patient/Caregiver Education & Training;Equipment Evaluation, Education, & procurement;Stair training   Patient Education Proper positioning to maximize knee flex ext and to control edema, proper exs and activity progression    Progress Note   See Progress Note Yes   License and Documentation Cosign   Therapy License Number Guido Sander PT [phone]   PT Whiteboard Notes   Therapy Whiteboard E 4-9 W, H      Electronically signed by Rafael Bihari, PT [phone] on 01/17/2013 at 4:30 PM

## 2013-01-17 NOTE — Progress Notes (Signed)
Physical Therapy  Progress Note  This note serves as patient discharge summary if pt discharges prior to next PT visit  Rebecca Horn       01/17/13    Restrictions/Precautions   Restrictions/Precautions Weight Bearing   Lower Extremity Weight Bearing Restrictions   Left Lower Extremity Weight Bearing Weight Bearing As Tolerated   General   Chart Reviewed Yes   Additional Pertinent Hx Elective L TKR 01-15-13 Dr. Berneda Rose.  WBAT, CPM 0-50, increase 10 degrees/day.  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   Family / Caregiver Present No   Referring Practitioner Dr. Berneda Rose   Pain Screening   Patient Currently in Pain Yes   Pain Assessment   Pain Assessment 0-10   Pain Level 4   Pain Type Surgical pain   Pain Location Knee   Pain Orientation Left   Orientation   Overall Orientation Status WNL   Transfers   Sit to Stand Supervision (slow)   Stand to sit Supervision    Ambulation   Ambulation? Yes   WB Status WBAT L LE   Ambulation 1   Surface level tile   Device Rolling Walker   Assistance Stand by assistance     Quality of Gait Interrrupted, step to.     Distance  STAIRS 70', limited by dizzyness   Up/down 6" platform step with wh walker and CGA   Balance   Comments Good Dynamic @ wh walker   Exercises   Knee Active Range of Motion Gentle AAROM 5-70   Comments Written TKR exs x 10 with cues, including gentle AAROM knee flex ext, and Skateboard knee flexion.  Client instructed in calf/HS stretch   Short term goals   Time Frame for Short term goals 1-3 days.     Short term goal 1 Client goal: to walk well enough to go home   Short term goal 2 Transfers Supervision 01-17-13: Goal met   Short term goal 3 Gait wh walker 49' Supervision   Short term goal 4 Platform Step with walker and CGA. 01-17-13: goal met   Short term goal 5 Indep written HEP   Assessment   Assessment Decreased functional mobility ;Decreased endurance   Treatment Diagnosis Elective L TKR 01-15-13  Dr. Berneda Rose.  WBAT, CPM 0-50, increase 10 degrees/day.  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    Specific instructions for Next Treatment Progress transfers, gt, exs, platform step as tolerated    Prognosis Good   Discharge Recommendations Home with assist PRN;Home PT   Requires PT Follow Up Yes   Time In 9:25   Time Out 10:00   Activity Tolerance   Activity Tolerance Tolerated well     Plan   Times per week daily-BID    Current Treatment Recommendations Strengthening;ROM;Functional Mobility Teacher, music;Teaching laboratory technician;Safety Education & Training;Patient/Caregiver Education & Training;Equipment Evaluation, Education, & procurement;Stair training   Patient Education Proper positioning to maximize knee flex ext and to control edema, proper exs and activity progression    Progress Note   See Progress Note Yes   License and Documentation Cosign   Therapy License Number Guido Sander PT [phone]   PT Whiteboard Notes   Therapy Whiteboard E 4-9 W, H      Electronically signed by Rafael Bihari, PT [phone] on 01/17/2013 at 12:03 PM

## 2013-01-17 NOTE — Progress Notes (Signed)
Progress Note  Admit Date: 01/15/2013      PCP: Kelly Splinter, MD     CC: F/U for medical management  SUBJECTIVE / Interval History:  No complaints, hypotension resolved  No Cp, syncope          Allergies  Penicillins    Medications    Scheduled Meds:  ??? ferrous sulfate  324 mg Oral BID WC   ??? calcium-vitamin D  1 tablet Oral Daily with breakfast   ??? sulfamethoxazole-trimethoprim  1 tablet Oral BID   ??? oxybutynin  10 mg Oral Nightly   ??? sodium chloride flush  10 mL Intravenous Q12H [facility]   ??? docusate sodium  100 mg Oral BID   ??? enoxaparin  40 mg Subcutaneous Daily   ??? insulin lispro  0-12 Units Subcutaneous TID WC   ??? insulin lispro  0-6 Units Subcutaneous Nightly     Continuous Infusions:  ??? sodium chloride 100 mL/hr at 01/16/13 0031       PRN Meds:  sodium chloride flush, acetaminophen, magnesium hydroxide, ondansetron, oxyCODONE-acetaminophen, oxyCODONE-acetaminophen, HYDROmorphone, HYDROmorphone    Vitals    TEMPERATURE:  Current - Temp: 97.4 ??F (36.3 ??C); Max - Temp  Avg: 97.9 ??F (36.6 ??C)  Min: 97.4 ??F (36.3 ??C)  Max: 98.2 ??F (36.8 ??C)  RESPIRATIONS RANGE: Resp  Avg: 18.3  Min: 18  Max: 20  PULSE RANGE: Pulse  Avg: 69.5  Min: 50  Max: 81  BLOOD PRESSURE RANGE:  Systolic (24hrs), Avg:114 mmHg, Min:99 mmHg, Max:139 mmHg  ; Diastolic (24hrs), Avg:70 mmHg, Min:60 mmHg, Max:83 mmHg    PULSE OXIMETRY RANGE: SpO2  Avg: 97.4 %  Min: 94 %  Max: 99 %  24HR INTAKE/OUTPUT:      Intake/Output Summary (Last 24 hours) at 01/17/13 1546  Last data filed at 01/17/13 1500   Gross per 24 hour   Intake      0 ml   Output   1880 ml   Net  -1880 ml       Exam:    Gen: No distress.   Eyes: PERRL. No sclera icterus. No conjunctival injection.   ENT: No discharge. Pharynx clear. External appearance of ears and nose normal.  Neck: Trachea midline. No obvious mass.    Resp: No accessory muscle use. No crackles. No wheezes. No rhonchi. No dullness on percussion.  CV: Regular rate. Regular rhythm. No murmur or rub. No edema.   GI:  Non-tender. Non-distended. No hernia.   Skin: Warm, dry, normal texture and turgor. No nodule on exposed extremities.   Lymph: No cervical LAD. No supraclavicular LAD.   M/S: No cyanosis. No clubbing. Ace + on L leg  Neuro: Moves all four extremities. CN 2-12 tested, no defect noted.  Psych: Oriented x 3. No anxiety.  Awake. Alert. Intact judgement and insight.    Data      CBC: Recent Labs      01/15/13   1315  01/16/13   0550  01/17/13   0416   HGB  12.6  10.1*  9.1*   HCT  38.5  30.8*  27.9*   PLT  249   --    --      BMP: Recent Labs      01/15/13   0845  01/16/13   0550   NA  137  131*   K  3.9  4.1   CL  99  98*   CO2  25  23  BUN  22*  19   CREATININE  0.6  0.6     POC GLUCOSE:    Recent Labs      01/16/13   1200  01/16/13   1632  01/16/13   2052  01/17/13   0751  01/17/13   1234   POCGLU  134*  116*  130*  92  86     LIVER PROFILE: No results found for this basename: AST, ALT, LIPASE,  AMYLASE,  ALB, BILIDIR, BILITOT, ALKPHOS,  in the last 72 hours  PT/INR: No results found for this basename: PROTIME, INR,  in the last 72 hours  APTT: No results found for this basename: APTT,  in the last 72 hours  UA:  Recent Labs      01/15/13   0845   COLORU  Yellow   PHUR  6.0   WBCUA  3-5   RBCUA  None seen   CLARITYU  Clear   SPECGRAV  1.020   LEUKOCYTESUR  SMALL*   UROBILINOGEN  0.2   BILIRUBINUR  Negative   BLOODU  Negative   GLUCOSEU  Negative   AMORPHOUS  1+*       Consults:     IP CONSULT TO HOSPITALIST  IP CONSULT TO CASE MANAGEMENT    ASSESSMENT AND PLAN      Active Problems:    * No active hospital problems. *    1. Knee replacement   -as per primary team     2.HTN   - restart med's on discharge if Bp is Ok    3. GERD   -ct meds     4. OSA   - patient does not want CPAP today, ct with O2    5. Blood loss anemia  - monitor, Hb dropping.    6. Hyponatremia  -monitor, repeat BMP            Diet: General  Code Status: Full Code    PT/OT Eval Status:noted    Discharge plan - as per primary  The patient and / or the  family were informed of the results of any tests, a time was given to answer questions, a plan was proposed and they agreed with plan.    Jim Desanctis, MD

## 2013-01-17 NOTE — Plan of Care (Signed)
Problem: Falls - Risk of  Goal: Absence of falls  Outcome: Ongoing  No falls noted this shift. Patient ambulates with x1 staff assistance without difficulty. Bed kept in low position. Safe environment maintained. Bedside table & call light in reach. Uses call light appropriately when needing assistance.   Electronically signed by Tylene Fantasia. Grieshop, RN on 01/17/2013 at 10:50 AM        Problem: PAIN  Goal: Patient???s pain/discomfort is manageable  Outcome: Ongoing  Pain /discomfort being managed with PRN analgesics per MD orders. Patient able to express presence and absence of pain and rate pain appropriately using numerical scale.   Electronically signed by Tylene Fantasia. Grieshop, RN on 01/17/2013 at 10:50 AM        Problem: SKIN INTEGRITY  Goal: Skin integrity is maintained or improved  Outcome: Ongoing  Pt able to turn self. Encouraged to turn Q2 hours. Skin assessment complete.  Electronically signed by Tylene Fantasia. Grieshop, RN on 01/17/2013 at 10:50 AM        Problem: Deep Venous Thrombosis - Risk of  Goal: Absence of deep venous thrombosis  Outcome: Ongoing  Pt presently absent of s/s of DVT, skin clean dry and intact. No increased redness or swelling, assessment complete. Electronically signed by Tylene Fantasia. Grieshop, RN on 01/17/2013 at 10:51 AM      Goal: Absence of impaired coagulation signs and symptoms  Outcome: Ongoing  Pt presently absent for s/s of impaired coagulation, all embolitic prevention method in place. No s/s of bruising or bleeding, dressing clean, dry and intact. Electronically signed by Tylene Fantasia. Grieshop, RN on 01/17/2013 at 10:51 AM        Problem: Infection - Risk of, Surgical Site Infection  Goal: Able to perform proper infection control measures  Outcome: Ongoing  Pt educated and demonstrates proper technique for infection control and prevention. Electronically signed by Tylene Fantasia. Grieshop, RN on 01/17/2013 at 10:52 AM      Goal: Absence of infection signs and symptoms  Outcome: Ongoing  Pt presently  absent of s/s of infection, all safety measures to prevent infection in place. Assessment complete. Electronically signed by Tylene Fantasia. Grieshop, RN on 01/17/2013 at 10:53 AM      Goal: Glucose level within specified parameters  Outcome: Ongoing  Glucose managed per MD orders, presently WDL. Electronically signed by Tylene Fantasia. Grieshop, RN on 01/17/2013 at 10:53 AM        Problem: Nausea/Vomiting  Goal: Able to drink  Outcome: Ongoing  Pt presently denies N/V and is tolerating PO fluids. Electronically signed by Tylene Fantasia. Grieshop, RN on 01/17/2013 at 10:54 AM      Goal: Absence of nausea  Outcome: Ongoing  Pt denies nausea and is tolerating PO fluids and meals Electronically signed by Tylene Fantasia. Synthia Innocent, RN on 01/17/2013 at 10:54 AM        Problem: Discharge Planning  Goal: Able to perform ADL  Outcome: Ongoing  Pt able to perform ADL's with minimal to no assist. Electronically signed by Tylene Fantasia. Grieshop, RN on 01/17/2013 at 10:54 AM      Goal: Able to tolerate oral medications  Outcome: Ongoing  Pt presently denies N/V and tolerates oral meds. Electronically signed by Tylene Fantasia. Grieshop, RN on 01/17/2013 at 10:55 AM      Goal: Adequate nutritional intake  Outcome: Ongoing  Pt consumes at least 50-75% of all meals, tolerates well. Electronically signed by Tylene Fantasia. Synthia Innocent, RN on 01/17/2013 at 10:56 AM  Goal: Participation in care planning  Outcome: Ongoing  Care plan reviewed with patient.  Patient verbalize understanding of the plan of care and contribute to goal setting.

## 2013-01-17 NOTE — Plan of Care (Signed)
Problem: Falls - Risk of  Goal: Absence of falls  Outcome: Ongoing  Fall risk assessment completed every shift. All precautions in place. Pt has call light within reach at all times. Room clear of clutter. Pt aware to call for assistance when getting up.     Problem: PAIN  Goal: Patient???s pain/discomfort is manageable  Outcome: Ongoing  Pain/discomfort being managed with PRN analgesics per MD orders. Pt able to express presence and absence of pain and rate pain appropriately using numerical scale.        Problem: SKIN INTEGRITY  Goal: Skin integrity is maintained or improved  Outcome: Ongoing  Skin assessment complete. No new signs of skin breakdown noted. Assistance provided with repositioning while in bed.

## 2013-01-17 NOTE — Progress Notes (Signed)
Occupational Therapy Daily Note  Rebecca Horn  Let this serve as discharge note if patient is discharged prior to next OT session   01/17/13 1104   Restrictions/Precautions   Restrictions/Precautions Weight Bearing   Lower Extremity Weight Bearing Restrictions   Left Lower Extremity Weight Bearing Weight Bearing As Tolerated   General   Chart Reviewed Yes   Additional Pertinent Hx Elective L TKR 01-15-13 Dr. Berneda Rose.  WBAT, CPM 0-50, increase 10 degrees/day.  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    Response to previous treatment Patient with no complaints from previous session   Family / Caregiver Present No   Referring Practitioner Dr Berneda Rose   Subjective   Subjective Patient met b/s, sitting in chair, agreeable to treat   Pain Screening   Patient Currently in Pain Yes   Pain Assessment 0-10   Pain Level 2   Pain Type Acute pain;Surgical pain   Pain Location Knee   Pain Orientation Left   Pain Intervention(s) Cold applied;Medication (see eMar)   Orientation   Overall Orientation Status WNL   ADL   Equipment Provided (patient has LE A/E at home )   LE Dressing Supervision  (pants don/doff supervision, socks/shoes Mod I with A/E )   Transfer Stand by assistance   Additional Comments chair-toilet in BR-chair with walker with SBA   Balance   Sitting Balance Independent   Standing Balance Stand by assistance   Short term goals   Time Frame for Short term goals by d/c from acute care: Pt goal to return directly home from hospital: status as of 01/17/13   Short term goal 1 Bed mobility w/ MinA: not addressed   Short term goal 2 ADL transfer/mobility w/ CGA/SBA: MET 4/10 Advance goal to Supervisory to Mod I range   Short term goal 3 UB ADLs w/ CGA/SBA: not addressed 4/10 Advance goal to Supervisory to Mod I range   Short term goal 4 LB ADLs w/ MinA: MET 4/10 Advance goal to Supervisory to Mod I range    Short term goal 5 Toilet w/ CGA/SBA: not addressed   Assessment: Patient doing very well, ok for home with assist from spouse as needed    Assessment Decreased functional mobility ;Decreased ADL status;Decreased functional activity tolerance   Rehab Potential Good   Recommendations Home with assist as needed   Requires OT Follow Up Yes   Time In 1035   Time Out 1105   Timed Code Treatment Minutes 30 Minutes   Total Treatment Time 30   Response to Treatment   Response to Treatment Patient Tolerated treatment well   Safety Devices   Safety Devices in place Yes   Type of devices Call light within reach;Chair alarm in place;Left in chair   Plan   Times per week 3-5   Times per day Daily   License and Documentation Cosign   Therapy License Number Linna Hoff, COTA/L [phone]   OT Whiteboard Notes   Therapy Whiteboard 4/8 W H

## 2013-01-17 NOTE — Progress Notes (Signed)
Pt A/O up to restroom to clean up, tolerates ambulation well. Pt vitals and assessment complete, pn managed per MD order. Pt has no present complaints, displays no s/s of distress. Will continue to monitor pt. Electronically signed by Tylene Fantasia. Synthia Innocent, RN on 01/17/2013 at 10:48 AM

## 2013-01-17 NOTE — Progress Notes (Signed)
Pt placed in CPM for one hour. Setting set to 70 degrees flexion and 0 degrees extension. Therapy well tolerated by pt. Will continue to monitor.

## 2013-01-18 LAB — BASIC METABOLIC PANEL
BUN: 9 mg/dL (ref 7–20)
CO2: 27 mmol/L (ref 21–32)
Calcium: 8.1 mg/dL — ABNORMAL LOW (ref 8.3–10.6)
Chloride: 99 mmol/L (ref 99–110)
Creatinine: 0.5 mg/dL — ABNORMAL LOW (ref 0.6–1.2)
GFR African American: >60 (ref >60)
GFR Non-African American: >60 (ref >60)
Glucose: 98 mg/dL (ref 70–99)
Potassium: 3.3 mmol/L — ABNORMAL LOW (ref 3.5–5.1)
Sodium: 135 mmol/L — ABNORMAL LOW (ref 136–145)

## 2013-01-18 LAB — POCT GLUCOSE
POC Glucose: 128 mg/dl — ABNORMAL HIGH (ref 70–99)
POC Glucose: 104 mg/dl — ABNORMAL HIGH (ref 70–99)

## 2013-01-18 LAB — HEMOGLOBIN AND HEMATOCRIT
Hematocrit: 26.4 % — ABNORMAL LOW (ref 36.0–48.0)
Hemoglobin: 8.8 g/dL — ABNORMAL LOW (ref 12.0–16.0)

## 2013-01-18 MED ORDER — ENOXAPARIN SODIUM 40 MG/0.4ML SC SOLN
40 MG/0.4ML | INJECTION | Freq: Every day | SUBCUTANEOUS | Status: AC
Start: 2013-01-18 — End: 2013-01-25

## 2013-01-18 MED ORDER — OXYCODONE-ACETAMINOPHEN 5-325 MG PO TABS
5-325 MG | ORAL_TABLET | Freq: Four times a day (QID) | ORAL | Status: AC | PRN
Start: 2013-01-18 — End: 2013-01-25

## 2013-01-18 MED ORDER — FERROUS SULFATE 324 (65 FE) MG PO TBEC
324 (65 Fe) MG | ORAL_TABLET | Freq: Two times a day (BID) | ORAL | Status: DC
Start: 2013-01-18 — End: 2013-02-06

## 2013-01-18 MED ADMIN — docusate sodium (COLACE) capsule 100 mg: 100 mg | ORAL | @ 12:00:00 | NDC 63739047810

## 2013-01-18 MED ADMIN — enoxaparin (LOVENOX) injection 40 mg: 40 mg | SUBCUTANEOUS | @ 12:00:00 | NDC 00075801401

## 2013-01-18 MED ADMIN — ferrous sulfate EC tablet 324 mg: 324 mg | ORAL | @ 12:00:00 | NDC 00574060810

## 2013-01-18 MED ADMIN — sulfamethoxazole-trimethoprim (BACTRIM DS) 800-160 MG per tablet 1 tablet: 1 | ORAL | NDC 68084023011

## 2013-01-18 MED ADMIN — oxybutynin (DITROPAN-XL) CR tablet 10 mg: 10 mg | ORAL | NDC 00378661001

## 2013-01-18 MED ADMIN — calcium-vitamin D (OSCAL-500) 500-200 MG-UNIT per tablet 1 tablet: 1 | ORAL | @ 12:00:00 | NDC 63739029101

## 2013-01-18 MED ADMIN — sodium chloride flush 0.9 % injection 10 mL: 10 mL | INTRAVENOUS | @ 12:00:00

## 2013-01-18 MED ADMIN — sulfamethoxazole-trimethoprim (BACTRIM DS) 800-160 MG per tablet 1 tablet: 1 | ORAL | @ 12:00:00 | NDC 68084023011

## 2013-01-18 MED ADMIN — oxyCODONE-acetaminophen (PERCOCET) 5-325 MG per tablet 2 tablet: 2 | ORAL | @ 01:00:00 | NDC 00406051223

## 2013-01-18 MED ADMIN — sodium chloride flush 0.9 % injection 10 mL: 10 mL | INTRAVENOUS | @ 02:00:00

## 2013-01-18 MED ADMIN — docusate sodium (COLACE) capsule 100 mg: 100 mg | ORAL | NDC 63739047810

## 2013-01-18 MED FILL — NORMAL SALINE FLUSH 0.9 % IV SOLN: 0.9 % | INTRAVENOUS | Qty: 10

## 2013-01-18 MED FILL — OXYCODONE-ACETAMINOPHEN 5-325 MG PO TABS: 5-325 MG | ORAL | Qty: 1

## 2013-01-18 MED FILL — FERROUS SULFATE 324 (65 FE) MG PO TBEC: 324 (65 Fe) MG | ORAL | Qty: 1

## 2013-01-18 MED FILL — SULFAMETHOXAZOLE-TMP DS 800-160 MG PO TABS: 800-160 MG | ORAL | Qty: 1

## 2013-01-18 MED FILL — LOVENOX 40 MG/0.4ML SC SOLN: 40 MG/0.4ML | SUBCUTANEOUS | Qty: 0.4

## 2013-01-18 MED FILL — DOK 100 MG PO CAPS: 100 MG | ORAL | Qty: 1

## 2013-01-18 MED FILL — OYST-CAL-D 500 500-200 MG-UNIT PO TABS: 500-200 MG-UNIT | ORAL | Qty: 1

## 2013-01-18 NOTE — Plan of Care (Signed)
Problem: Falls - Risk of  Goal: Absence of falls  Outcome: Ongoing  Fall risk assessment completed every shift. All precautions in place. Pt has call light within reach at all times. Room clear of clutter. Pt aware to call for assistance when getting up.     Problem: PAIN  Goal: Patient???s pain/discomfort is manageable  Outcome: Ongoing  Pain/discomfort being managed with PRN analgesics per MD orders. Pt able to express presence and absence of pain and rate pain appropriately using numerical scale.        Problem: SKIN INTEGRITY  Goal: Skin integrity is maintained or improved  Outcome: Ongoing  Skin assessment completed every shift. Pt assessed for incontinence, appropriate barrier cream applied prn.  Pt encouraged to turn/rotate every 2 hours. Assistance provided if pt unable to do so themselves.

## 2013-01-18 NOTE — Discharge Summary (Signed)
PATIENT NAME:                     Rebecca Horn  DATE OF BIRTH:         Apr 28, 1951   MEDICAL RECORD NUMBER         [id number]  SURGERY DATE:         01/18/2013  SURGEON:                 Deland Pretty    ADMISSION DIAGNOSIS: Severe osteoarthritis left knee.     DISCHARGE DIAGNOSIS: Status post left total knee arthroplasty.     HISTORY OF PRESENT ILLNESS: Rebecca Horn is a  62 y.o. female with a long-standing history of left knee osteoarthritis. Despite conservative measures, the patient had severe persistent pain. The pain affected most activities of daily living. Due to this fact, the patient was ultimately cleared and scheduled for a total knee arthroplasty.     HOSPITAL COURSE: Left total knee arthroplasty was   performed by myself. The patient tolerated the procedure quite well. Post-operatively the patient???s diet was advanced as tolerated.Postoperatively the patient's home prescription medications were resumed. The patient was put on CPM for range of motion. The patient remained neurovascularly intact in the lower extremity and had intact pulses distally.  Patient???s calves remained soft and showed no evidence of DVT.  The patient was able to move their leg and ankle/foot without any problems post-operatively.  Physical therapy and occupational therapy were consulted and began working with the patient post-operatively. The patient progressed with PT/OT as would be expected and continued to improve through their stay. Initially the patient had excellent pain control with IV narcotics. We then transitioned the patient to p.o. pain medication, and the patient continued to progress rather well. The patient was started on Lovenox for DVT prophylaxis on postoperative day 1. The patient was continued on appropriate antibiotics per SCIP measures. The Foley catheter was discontinued within 48 hours. The dressing was changed on postoperative day 2, and the wound was clean and dry. The patient did   develop a  mild acute blood loss anemia and iron sulfate twice a day was started. From a medical standpoint the patient remained stable and continued to have the medicine team follow throughout their stay. The patient was ready for discharge on postoperative day number 3. The patient was discharged to Home. The patient will continue aggressive total knee rehab and will continue to follow the total knee precautions outlined to them by myself and PT/OT.    The staples were to be removed 10 days postoperatively and Steri-Strips were to be applied. The patient was to follow up with Dr. Berneda Rose in 2 to 3 weeks. The patient was to continue the Lovenox as directed. Patient was instructed on the use of pain medications, the signs and symptoms of infection, and was given our number to call should they have any questions or concerns following discharge.    Condition on Discharge: Stable      Electronically signed by Deland Pretty, MD on 01/18/2013 at 6:48 AM

## 2013-01-18 NOTE — Progress Notes (Signed)
Doing well.  Hgb 8.8.  Home today.

## 2013-01-18 NOTE — Progress Notes (Signed)
SW met with pt at bedside, pt in agreement to d/c today.  IMM signed, Faxed orders to [facility] and spoke with them re d/c for today    Jamelle Rushingngela Nahsir Venezia [facility] LLCSW   [phone]

## 2013-01-18 NOTE — Progress Notes (Signed)
Pt A/O, in room with breakfast. Bed in lowest position, bed locked, rails up 2/4, tray and call light within reach, bed alarm on, teds and pnuemos on. Pt assessment and vitals complete, pt states full sensation. Will continue to monitor. Electronically signed by Tylene Fantasia. Synthia Innocent, RN on 01/18/2013 at 8:27 AM

## 2013-01-18 NOTE — Telephone Encounter (Signed)
Spoke with pharmacy.

## 2013-01-18 NOTE — Progress Notes (Signed)
Physical Therapy  Discharge Summary     Rebecca Horn       01/18/13    Restrictions/Precautions   Restrictions/Precautions Weight Bearing   Lower Extremity Weight Bearing Restrictions   Left Lower Extremity Weight Bearing Weight Bearing As Tolerated   General   Chart Reviewed Yes   Additional Pertinent Hx Elective L TKR 01-15-13 Dr. Berneda Rose.  WBAT, CPM 0-50, increase 10 degrees/day.  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   Family / Caregiver Present No   Referring Practitioner Dr. Berneda Rose   Pain Screening   Patient Currently in Pain Yes   Pain Assessment   Pain Assessment 0-10   Pain Level 4   Pain Type Surgical pain   Pain Location Knee   Pain Orientation Left   Orientation   Overall Orientation Status WNL   Transfers   Sit to Stand Modified Independent    Stand to sit Modified Independent    Ambulation   Ambulation? Yes   WB Status WBAT L LE   Ambulation 1   Surface level tile   Device Multimedia programmer    Quality of Gait Interrrupted, step through .     Distance  STAIRS 100'     Balance   Comments Good Dynamic @ wh walker   Exercises   Knee Active Range of Motion Gentle AAROM 5-75   Comments Written TKR exs x 10 independently, including gentle AAROM knee flex ext, and Skateboard knee flexion.  Client instructed in calf/HS stretch   Short term goals   Time Frame for Short term goals 1-3 days.     Short term goal 1 Client goal: to walk well enough to go home   Short term goal 2 Transfers Supervision 01-17-13: Goal met   Short term goal 3 Gait wh walker 90' Supervision. 01-18-13 Goal Met   Short term goal 4 Platform Step with walker and CGA. 01-17-13: goal met   Short term goal 5 Indep written HEP 01-18-13 Goal met    Assessment   Assessment Decreased functional mobility ;Decreased endurance   Treatment Diagnosis Elective L TKR 01-15-13 Dr. Berneda Rose.  WBAT, CPM 0-50, increase 10 degrees/day.  PMHx: R [facility] 10-16-12    Specific instructions for  Next Treatment    Prognosis Good   Discharge Recommendations Home with assist PRN;Home PT   Requires PT Follow Up    Time In 9:45   Time Out 10:15   Activity Tolerance   Activity Tolerance Tolerated well     Plan   Times per week    Current Treatment Recommendations    Patient Education Proper positioning to maximize knee flex ext and to control edema, proper exs and activity progression    Progress Note   See Progress Note Yes   License and Documentation Cosign   Therapy License Number Guido Sander PT [phone]   PT Whiteboard Notes   Therapy Whiteboard E 4-9 W, H F     Electronically signed by Rafael Bihari, PT [phone] on 01/18/2013 at 11:38 AM

## 2013-01-18 NOTE — Discharge Instructions (Signed)
General Diet

## 2013-01-18 NOTE — Telephone Encounter (Signed)
pls call [facility] at [phone]- they are needing clarification on generic Lovenox pt was given

## 2013-01-18 NOTE — Progress Notes (Signed)
Pt D/C to home with spouse. Pt received and verbalizes understanding regarding discharge teaching. Pt received home prescriptions, presently denies pain. Pt has all belongings, instructions and prescriptions in her possession upon d/c. Pt leaving in wheelchair to home with home care. Last neuro checks complete, pt states full sensation. Electronically signed by Tylene Fantasia. Synthia Innocent, RN on 01/18/2013 at 1:47 PM

## 2013-01-18 NOTE — Discharge Instructions (Signed)
Up as Tolerated

## 2013-01-18 NOTE — Progress Notes (Signed)
Progress Note  Admit Date: 01/15/2013      PCP: Kelly Splinter, MD     CC: F/U for medical management  SUBJECTIVE / Interval History:  No complaints,   No Cp, syncope          Allergies  Penicillins    Medications    Scheduled Meds:  ??? ferrous sulfate  324 mg Oral BID WC   ??? calcium-vitamin D  1 tablet Oral Daily with breakfast   ??? sulfamethoxazole-trimethoprim  1 tablet Oral BID   ??? oxybutynin  10 mg Oral Nightly   ??? sodium chloride flush  10 mL Intravenous Q12H [place]   ??? docusate sodium  100 mg Oral BID   ??? enoxaparin  40 mg Subcutaneous Daily   ??? insulin lispro  0-12 Units Subcutaneous TID WC   ??? insulin lispro  0-6 Units Subcutaneous Nightly     Continuous Infusions:       PRN Meds:  sodium chloride flush, acetaminophen, magnesium hydroxide, ondansetron, oxyCODONE-acetaminophen, oxyCODONE-acetaminophen, HYDROmorphone, HYDROmorphone    Vitals    TEMPERATURE:  Current - Temp: 97.7 ??F (36.5 ??C); Max - Temp  Avg: 98.4 ??F (36.9 ??C)  Min: 97.7 ??F (36.5 ??C)  Max: 99.5 ??F (37.5 ??C)  RESPIRATIONS RANGE: Resp  Avg: 18.3  Min: 18  Max: 20  PULSE RANGE: Pulse  Avg: 83.3  Min: 79  Max: 90  BLOOD PRESSURE RANGE:  Systolic (24hrs), Avg:108 mmHg, Min:95 mmHg, Max:120 mmHg  ; Diastolic (24hrs), Avg:70 mmHg, Min:66 mmHg, Max:82 mmHg    PULSE OXIMETRY RANGE: SpO2  Avg: 97.3 %  Min: 94 %  Max: 100 %  24HR INTAKE/OUTPUT:      Intake/Output Summary (Last 24 hours) at 01/18/13 1357  Last data filed at 01/18/13 0805   Gross per 24 hour   Intake    240 ml   Output   2250 ml   Net  -2010 ml       Exam:    Gen: No distress.   Eyes: PERRL. No sclera icterus. No conjunctival injection.   ENT: No discharge. Pharynx clear. External appearance of ears and nose normal.  Neck: Trachea midline. No obvious mass.    Resp: No accessory muscle use. No crackles. No wheezes. No rhonchi. No dullness on percussion.  CV: Regular rate. Regular rhythm. No murmur or rub. No edema.   GI: Non-tender. Non-distended. No hernia.   Skin: Warm, dry, normal texture  and turgor. No nodule on exposed extremities.   Lymph: No cervical LAD. No supraclavicular LAD.   M/S: No cyanosis. No clubbing. Ace + on L leg  Neuro: Moves all four extremities. CN 2-12 tested, no defect noted.  Psych: Oriented x 3. No anxiety.  Awake. Alert. Intact judgement and insight.    Data      CBC: Recent Labs      01/16/13   0550  01/17/13   0416  01/18/13   0544   HGB  10.1*  9.1*  8.8*   HCT  30.8*  27.9*  26.4*     BMP: Recent Labs      01/16/13   0550  01/18/13   0544   NA  131*  135*   K  4.1  3.3*   CL  98*  99   CO2  23  27   BUN  19  9   CREATININE  0.6  0.5*     POC GLUCOSE:    Recent Labs  01/17/13   0751  01/17/13   1234  01/17/13   1701  01/17/13   2041  01/18/13   0743   POCGLU  92  86  115*  128*  104*     LIVER PROFILE: No results found for this basename: AST, ALT, LIPASE,  AMYLASE,  ALB, BILIDIR, BILITOT, ALKPHOS,  in the last 72 hours  PT/INR: No results found for this basename: PROTIME, INR,  in the last 72 hours  APTT: No results found for this basename: APTT,  in the last 72 hours  UA:  No results found for this basename: NITRITE, COLORU, PHUR, LABCAST, WBCUA, RBCUA, MUCUS, TRICHOMONAS, YEAST, BACTERIA, CLARITYU, SPECGRAV, LEUKOCYTESUR, UROBILINOGEN, BILIRUBINUR, BLOODU, GLUCOSEU, KETONESU, AMORPHOUS,  in the last 72 hours    Consults:     IP CONSULT TO HOSPITALIST  IP CONSULT TO CASE MANAGEMENT    ASSESSMENT AND PLAN      Active Problems:    * No active hospital problems. *    1. Knee replacement   -as per primary team     2.HTN   - restart med's on discharge if Bp is Ok    3. GERD   -ct meds     4. OSA   - on CPAP    5. Blood loss anemia  - monitor, Hb dropping.Start iron supplements on DC.     6 Hypokalemia            Diet: General  Code Status: Full Code    PT/OT Eval Status:noted    Discharge plan - today.  The patient and / or the family were informed of the results of any tests, a time was given to answer questions, a plan was proposed and they agreed with plan.    Jim Desanctis, MD

## 2013-01-18 NOTE — Discharge Instructions (Signed)
DISCHARGE INSTRUCTIONS FOR TOTAL JOINT REPLACEMENT  Activity:    Elevate your leg if swelling occurs in your ankle.  Use elastic wraps/hose until swelling decreases.    Continue the exercise program as prescribed by physical therapists.    Take frequent walks.    Use walker, crutches, or cane with weight bearing instructions as indicated by the physical therapists.    Take rest periods often.  Elevate leg during rest period.  Hip Replacement Only:    Avoid sitting in low chairs or toilets without raised seats.    Keep knees apart.  Sleep with a pillow between your legs.    Do not cross your legs, especially when putting on shoes and socks.  Wound Care:    Cover the wound with a sterile gauze dressing and change daily as long as there is drainage.    Stitches/Staples will be removed 10 days post op.    Steri strips (white tape) may be placed over your incision after the staples have been removed.  If they have not fallen off in seven days, you may remove them.    Shower with a Tegaderm dressing until the staples are removed.    Do not scrub wound.  Pat it dry with a soft towel.    Don't apply any lotions or creams to your wound.    Check the incision every day for redness, swelling, or increase in drainage.  Diet:    You can resume your normal diet.  There are no limits on your diet due to your surgery.    Pain pills and activity changes may lead to constipation.  To prevent this, use prune juice or bran cereals liberally.  You may need to use a laxative such as Dulcolax, Senokot, or Milk of Magnesia.    Drink plenty of fluids.  Medications:    Take pain pills as ordered to maintain comfort.    Never drive while taking pain medicine.    Avoid over the counter medications until checking with your doctor.    Resume previous medications as instructed by your doctor.    Take the Lovenox as directed and until completed.  Call Your Doctor If:    You have increased pain not controlled by medications.    Excessive swelling  in your ankle.    You develop numbness, tingling, or decreased movement.    You have a fever greater than 100 degrees for a day or over 101 degrees at any one time.    Your wound becomes more reddened, starts draining, or opens.    If you fall.  You have any questions about your recovery.    Inform your family doctor/dentist or any other doctor who cares for you in the future that you have a joint replacement.  They may want to order antibiotics for dental or surgical procedures.    If you have required the use of insulin to control your blood sugar after surgery, follow up with your family doctor.    Call your surgeon's office to schedule your appointment to be seen 2 to 3 weeks after surgery.     Make your appointment to continue physical therapy per doctor's orders.    Smoking cessation assistance can be obtained from your family doctor or by calling [place] [facility] @ [phone]    _______________________________   _____   _______________________  ____                Patient Signature                Date      Witness                               Date        Oxycodone and Acetaminophen   En Espaol (Spanish Version)   Last modified: 11/12/2009   The following information is an educational aid only. It is not intended as medical advice for individual conditions or treatments. Talk to your doctor, nurse or pharmacist before following any medical regimen to see if it is safe and effective for you.   Pronunciation   (oks i KOE done & a seet a MIN oh fen)   Pharmacologic Category   Analgesic, Opioid   U.S[place] Brand Names   Endocet, Percocet, Primlev, Roxicet, Roxicet 5/500, Tylox   [place] Brand Names   Endocet, Novo-Oxycodone Acet, Oxycocet, Percocet, Percocet-Demi, PMS-Oxycodone-Acetaminophen   Reasons not to take this medicine    If you have an allergy to oxycodone, acetaminophen, or any other part of this medicine.     Tell healthcare provider if you are allergic to any medicine. Make sure to tell about the allergy  and how it affected you. This includes telling about rash; hives; itching; shortness of breath; wheezing; cough; swelling of face, lips, tongue, or throat; or any other symptoms involved.     If you have any of the following conditions: Kidney disease, liver disease, or lung disease.    What is this medicine used for?   This medicine is used to relieve pain.   How does it work?    Oxycodone binds to brain receptors, relieving pain. It decreases the feeling of pain and a person's response to pain.     Acetaminophen blocks production and release of chemicals that cause pain.    How is it best taken?    Take this medicine with or without food. Take with food if it causes an upset stomach.     Drink plenty of noncaffeine-containing liquid unless told to drink less liquid by healthcare provider.     A liquid (solution) is available if you cannot swallow pills.     Those who have feeding tubes can also use the liquid. Flush the feeding tube before and after medicine is given.    What do I do if I miss a dose? (does not apply to patients in the hospital)    Take a missed dose as soon as possible.     If it is almost time for the next dose, skip the missed dose and return to your regular schedule.     Many times this medicine is taken on an as needed basis.    What are the precautions when taking this medicine?    This medicine may be habit-forming with long-term use.     Avoid other sources of acetaminophen. An overdose may cause dangerous problems.     Do not take more than 10 teaspoonfuls or 10 tablets in 24 hours.     If you are 34 or older, use this medicine with caution. You could have more side effects.     Check medicines with healthcare provider. This medicine may not mix well with other medicines.     You may not be alert. Avoid driving, doing other tasks or activities until you see how this medicine affects you.     Avoid medicines and natural products that slow your actions and reactions. These  include sedatives, tranquilizers,  mood stabilizers, antihistamines, and other pain medicine.     Avoid or limit alcohol intake (includes wine, beer, and liquor) to less than 3 drinks a day. Drinking too much alcohol may increase the risk of liver disease.     Be careful if you have G6PD deficiency. Anemia may occur.     Tell healthcare provider if you are pregnant or plan on getting pregnant.     Tell healthcare provider if you are breast-feeding.    What are some possible side effects of this medicine?    Feeling dizzy. Rise slowly over several minutes from sitting or lying position. Be careful climbing.     Feeling lightheaded, sleepy, having blurred vision, or a change in thinking clearly. Avoid driving, doing other tasks or activities that require you to be alert or have clear vision until you see how this medicine affects you.     Nausea or vomiting. Small frequent meals, frequent mouth care, sucking hard, sugar-free candy, or chewing sugar-free gum may help.     Constipation. More liquids, regular exercise, or a fiber-containing diet may help. Talk with healthcare provider about a stool softener or laxative.     Liver damage can rarely occur.    What should I monitor?    Change in condition being treated. Is it better, worse, or about the same?     Keep a diary of pain control.     Bowel movements.    Reasons to call healthcare provider immediately    If you suspect an overdose, call your local poison control center or emergency department immediately.     Signs of a life-threatening reaction. These include wheezing; chest tightness; fever; itching; bad cough; blue skin color; fits; or swelling of face, lips, tongue, or throat.     Severe dizziness or passing out.     Difficulty breathing.     Significant change in thinking clearly and logically.     Poor pain control.     Severe nausea or vomiting.     Severe constipation.     Feeling extremely tired or weak.     Yellow skin or eyes.      Not able to eat.     Any rash.     No improvement in condition or feeling worse.    How should I store this medicine?    Store at room temperature.     Protect from light.     Protect caplets, capsules, and tablets from moisture. Do not store in a bathroom or kitchen.     Throw away any unused medicine by flushing down toilet or sink.    General statements    If you have a life-threatening allergy, wear allergy identification at all times.     Do not share your medicine with others and do not take anyone else's medicine.     Keep all medicine out of the reach of children and pets.     Keep a list of all your medicines (prescription, natural products, supplements, vitamins, over-the-counter) with you. Give this list to healthcare provider (doctor, nurse, nurse practitioner, pharmacist, physician assistant).     In [place] return any unused drugs back to the pharmacy. Also, visit http://www.hc-sc.gc.ca/hl-vs/iyh-vsv/med/disposal-defaire-eng.php#th for more facts about the right way to get rid of unused drugs.     Call your doctor for medical advice about side effects. You may report side effects to FDA at 1-800-FDA-1088 or in [place] to [employer]'s [place] Vigilance Program at [phone].  Talk with healthcare provider before starting any new medicine, including over-the-counter, natural products, or vitamins.    Please be aware that this information is provided to supplement the care provided by your physician. It is neither intended nor implied to be a substitute for professional medical advice. CALL YOUR HEALTHCARE PROVIDER IMMEDIATELY IF YOU THINK YOU MAY HAVE A MEDICAL EMERGENCY. Always seek the advice of your physician or other qualified health provider prior to starting any new treatment or with any questions you may have regarding a medical condition.   Last modified: 11/12/2009     Enoxaparin   En Espaol (Spanish Version)   Last modified: 12/25/2009   The following information is an  educational aid only. It is not intended as medical advice for individual conditions or treatments. Talk to your doctor, nurse or pharmacist before following any medical regimen to see if it is safe and effective for you.   Pronunciation   (ee noks a PA rin)   Pharmacologic Category   Low Molecular Weight Heparin   U.S. Brand Names   Lovenox   [place] Brand Names   Enoxaparin Injection, Lovenox, Lovenox HP   [place] Brand Names   Clexane   What key warnings should I know about before taking this medicine?    People who have had recent spinal anesthesia, epidurals, or spinal taps are more likely to have bleeding problems when started on this medicine. This bleeding rarely happens, but can be severe. Tell healthcare provider if you have had any spinal procedures. Do not take any other blood-thinner medicines including nonsteroidal anti-inflammatory agents.     Tell healthcare provider you use this medicine before you receive spinal anesthesia or a spinal procedure.     This medicine is not recommended in pregnant women with heart valve replacements.    Reasons not to take this medicine    If you have an allergy to enoxaparin or any other part of this medicine.     If you are allergic to pork products, talk with healthcare provider.     Tell healthcare provider if you are allergic to any medicine. Make sure to tell about the allergy and how it affected you. This includes telling about rash; hives; itching; shortness of breath; wheezing; cough; swelling of face, lips, tongue, or throat; or any other symptoms involved.     If you have any of the following conditions: Bleeding problems or low platelet count during previous use.     If you know that you will not take the medicine as directed.     If you are pregnant and have a heart valve replacement.    What is this medicine used for?    This medicine is used to thin the blood so that clots will not form.     This medicine is used to treat blood clots.      This medicine is used to decrease heart attacks in patients who have unstable angina or mild heart attacks.    How does it work?   Enoxaparin changes the body's clotting system. It thins the blood to prevent clots from forming.   How is it best taken?    Use prescription as directed, even if feeling better.     To gain the most benefit, do not miss doses.     This medicine is given as a shot into the fatty part of the skin on the right or left side of the stomach.     Your healthcare  provider may teach you how to give the shot.     Wash hands before and after use.     Move site where you give the shot with each shot.     If using prefilled syringe, do not get rid of air bubble from syringe before giving.     Throw away needles in needle/sharp disposal box and return box to healthcare provider when full.    What do I do if I miss a dose? (does not apply to patients in the hospital)    Take a missed dose as soon as possible.     If it is almost time for the next dose, skip the missed dose and return to your regular schedule.     Do not take a double dose or extra doses.     Do not change dose or stop medicine. Talk with healthcare provider.    What are the precautions when taking this medicine?    Wear disease medical alert identification.     If you are 73 or older, use this medicine with caution. You could have more side effects.     If you have kidney disease, talk with healthcare provider.     Tell dentists, surgeons, and other healthcare providers that you use this medicine.     You may bleed more easily. Be careful. Avoid injury. Use soft toothbrush, electric razor.     Check medicines with healthcare provider. This medicine may not mix well with other medicines.     Talk with healthcare provider before using other: aspirin, aspirin-containing products, blood thinners, garlic, ginseng, ginkgo, ibuprofen or like products, pain medicines, or vitamin E.     Use caution if you weigh less than  100 pounds.     Use caution to prevent injury and avoid falls or accidents.     Tell healthcare provider if you are pregnant or plan on getting pregnant.     Tell healthcare provider if you are breast-feeding.    What are some possible side effects of this medicine?    Bleeding problems.     Irritation where the shot is given.    What should I monitor?    Change in condition being treated. Is it better, worse, or about the same?     Signs or symptoms of bleeding.     Follow up with healthcare provider.    Reasons to call healthcare provider immediately    If you suspect an overdose, call your local poison control center or emergency department immediately.     Signs of a life-threatening reaction. These include wheezing; chest tightness; fever; itching; bad cough; blue skin color; fits; or swelling of face, lips, tongue, or throat.     Fast heartbeat.     Severe dizziness or passing out.     Falls or accidents, especially if you hit your head. Talk with healthcare provider even if you feel fine.     Swelling or pain of leg or arm.     Significant change in thinking clearly and logically.     Severe nausea or vomiting.     Severe headache.     Weakness, numbness, or tingling.     Unusual bruising or bleeding.     Any rash.     No improvement in condition or feeling worse.    How should I store this medicine?   Store at room temperature.   General statements    If you have a life-threatening allergy,  wear allergy identification at all times.     Do not share your medicine with others and do not take anyone else's medicine.     Keep all medicine out of the reach of children and pets.     Most medicines can be thrown away in household trash after mixing with coffee grounds or kitty litter and sealing in a plastic bag.     In [place] return any unused drugs back to the pharmacy. Also, visit http://www.hc-sc.gc.ca/hl-vs/iyh-vsv/med/disposal-defaire-eng.php#th for more facts about the right way to  get rid of unused drugs.     Keep a list of all your medicines (prescription, natural products, supplements, vitamins, over-the-counter) with you. Give this list to healthcare provider (doctor, nurse, nurse practitioner, pharmacist, physician assistant).     Call your doctor for medical advice about side effects. You may report side effects to FDA at 1-800-FDA-1088 or in [place] to [employer]'s [place] Vigilance Program at [phone].     Talk with healthcare provider before starting any new medicine, including over-the-counter, natural products, or vitamins.    Please be aware that this information is provided to supplement the care provided by your physician. It is neither intended nor implied to be a substitute for professional medical advice. CALL YOUR HEALTHCARE PROVIDER IMMEDIATELY IF YOU THINK YOU MAY HAVE A MEDICAL EMERGENCY. Always seek the advice of your physician or other qualified health provider prior to starting any new treatment or with any questions you may have regarding a medical condition.   Last modified: 12/25/2009         Ferrous Sulfate   En Espaol (Spanish Version)   Last modified: 01/06/2010   The following information is an educational aid only. It is not intended as medical advice for individual conditions or treatments. Talk to your doctor, nurse or pharmacist before following any medical regimen to see if it is safe and effective for you.   Pronunciation   (FER [place] SUL fate)   Pharmacologic Category   Iron Salt   U.S. Brand Names   Feosol [OTC], Fer-In-Sol [OTC], Fer-iron [OTC], MyKidz Iron 10 [OTC], Slow FE [OTC]   [place] Brand Names   Apo-Ferrous Sulfate, Fer-In-Sol, Ferodan   [place] Brand Names   Hemobion   What key warnings should I know about before taking this medicine?    Keep away from children. Accidental poisonings with iron occur most often in children. Initial signs of overdose include feeling tired, nausea, vomiting, belly pain, dark tarry-black stool, and weak or  fast heartbeat.     This medicine does not mix well with some medicines. Serious reactions may occur. Check all medicines with healthcare provider.    Reasons not to take this medicine    If you have an allergy to ferrous sulfate or any other part of this medicine.     Tell healthcare provider if you are allergic to any medicine. Make sure to tell about the allergy and how it affected you. This includes telling about rash; hives; itching; shortness of breath; wheezing; cough; swelling of face, lips, tongue, or throat; or any other symptoms involved.     If you have any of the following conditions: Anemia from a cause other than low iron stores, too much iron in your body, or thalassemia.    What is this medicine used for?    This medicine is used to treat anemia.     Iron is usually recommended during pregnancy to prevent anemia.    How does it work?  Iron plays an important role in taking oxygen and transporting it to where it is needed in the body. Iron is taken into the red blood cells and made into hemoglobin. Hemoglobin actually carries the oxygen around in the body.   How is it best taken?    Take this medicine with or without food. Take with food if it causes an upset stomach.     Take this medicine 1 hour before or 2 hours after bran, fiber supplements, tea, coffee, dairy products, or eggs.     Long-acting products: Swallow whole. Do not chew, break, or crush.     Mix liquid with water or juice and drink through a straw to decrease stains on teeth. Rinse mouth.     Follow diet plan as recommended by healthcare provider.    What do I do if I miss a dose? (does not apply to patients in the hospital)    Take a missed dose as soon as possible.     If it is almost time for the next dose, skip the missed dose and return to your regular schedule.     Do not take a double dose or extra doses.     Do not change dose or stop medicine. Talk with healthcare provider.    What are the precautions when  taking this medicine?    Check medicines with healthcare provider. This medicine may not mix well with other medicines.     Do not take calcium, zinc, copper supplement, or multivitamins containing any of these minerals within 2 hours of this medicine.     Do not take antacids within 2 hours of this medicine.     Do not take tetracycline within 4 hours of this medicine.     Take antibiotics like ciprofloxacin and levofloxacin 2 hours before this medicine.     Tell healthcare provider if you are pregnant or plan on getting pregnant.     Tell healthcare provider if you are breast-feeding.    What are some possible side effects of this medicine?    Belly pain.     Nausea or vomiting. Small frequent meals, frequent mouth care, sucking hard, sugar-free candy, or chewing sugar-free gum may help.     Diarrhea.     Constipation. More liquids, regular exercise, or a fiber-containing diet may help. Talk with healthcare provider about a stool softener or laxative.     Black stools.    What should I monitor?    Change in condition being treated. Is it better, worse, or about the same?     Follow up with healthcare provider.    Reasons to call healthcare provider immediately    If you suspect an overdose, call your local poison control center or emergency department immediately.     Signs of a life-threatening reaction. These include wheezing; chest tightness; fever; itching; bad cough; blue skin color; fits; or swelling of face, lips, tongue, or throat.     Severe nausea or vomiting.     Severe diarrhea.     Severe constipation.     If a child eats your tablets. Accidental poisonings with iron occur most often in children. Initial signs of overdose include feeling tired, nausea, vomiting, belly pain, dark tarry-black stool, and weak or fast heartbeat.     For females, menstrual changes. These include lots of bleeding, spotting, or bleeding between cycles.     Any rash.     No improvement in condition or  feeling  worse.    How should I store this medicine?    Store at room temperature.     Protect tablets from moisture. Do not store in a bathroom or kitchen.    General statements    If you have a life-threatening allergy, wear allergy identification at all times.     Do not share your medicine with others and do not take anyone else's medicine.     Keep all medicine out of the reach of children and pets.     Most medicines can be thrown away in household trash after mixing with coffee grounds or kitty litter and sealing in a plastic bag.     In [place] return any unused drugs back to the pharmacy. Also, visit http://www.hc-sc.gc.ca/hl-vs/iyh-vsv/med/disposal-defaire-eng.php#th for more facts about the right way to get rid of unused drugs.     Keep a list of all your medicines (prescription, natural products, supplements, vitamins, over-the-counter) with you. Give this list to healthcare provider (doctor, nurse, nurse practitioner, pharmacist, physician assistant).     Call your doctor for medical advice about side effects. You may report side effects to FDA at 1-800-FDA-1088 or in [place] to [employer]'s [place] Vigilance Program at [phone].     Talk with healthcare provider before starting any new medicine, including over-the-counter, natural products, or vitamins.    Please be aware that this information is provided to supplement the care provided by your physician. It is neither intended nor implied to be a substitute for professional medical advice. CALL YOUR HEALTHCARE PROVIDER IMMEDIATELY IF YOU THINK YOU MAY HAVE A MEDICAL EMERGENCY. Always seek the advice of your physician or other qualified health provider prior to starting any new treatment or with any questions you may have regarding a medical condition.   Last modified: 01/06/2010

## 2013-01-25 MED ORDER — OXYCODONE-ACETAMINOPHEN 5-325 MG PO TABS
5-325 MG | ORAL_TABLET | Freq: Four times a day (QID) | ORAL | Status: DC | PRN
Start: 2013-01-25 — End: 2013-02-01

## 2013-01-25 NOTE — Telephone Encounter (Signed)
Rebecca Horn is calling for a refill of the following medication: Percocet  Last filled: 4/11  Last OV: 4/8 surgery  Next OV: 4/21    The patients preferred pharmacy was confirmed and the patient is aware that a response to the request may take up to 24 hours, and to check with the pharmacy prior to calling the office back. \\

## 2013-01-25 NOTE — Telephone Encounter (Signed)
Left message that her meds are at [facility] location.

## 2013-01-28 MED ORDER — HYDROCHLOROTHIAZIDE 12.5 MG PO CAPS
12.5 MG | ORAL_CAPSULE | Freq: Every day | ORAL | Status: DC
Start: 2013-01-28 — End: 2013-04-16

## 2013-01-28 NOTE — Telephone Encounter (Signed)
Per Dr. Kathie Rhodes and Dr. Berneda Rose, d/c on 4/11

## 2013-01-28 NOTE — Telephone Encounter (Signed)
Go ahead and refill. She could probably use the littlle diuresis

## 2013-01-28 NOTE — Progress Notes (Signed)
Rebecca Horn  [id number]  January 28, 2013    Chief Complaint   Patient presents with   ??? Post-Op Check     left knee DOS 01/15/13             History:  The patient is here for followup regarding her left knee.  She is now 2 weeks status post left total knee arthroplasty. She is doing extremely well.    Resp 17   Ht 5\' 2"  (1.575 m)   Wt 231 lb (104.781 kg)   BMI 42.24 kg/m2    Physical: Rebecca Horn appears well, she is in no apparent distress, she demonstrates appropriate mood & affect. She is alert and oriented to person, place and time. She has mild swelling. There is No evidence of DVT seen on physical exam.. She is neurovascularly intact distally. Range of motion is from 0 degrees to 90 degrees. The incision is  clean, dry and intact and without erythema. Strength in the knee is 4/5. There is no instability with varus and valgus stressing of the knee. There is no pain with range of motion of the hips.    Xrays: Three views of the left knee were obtained and reviewed. The prosthesis is well aligned and there is no evidence of loosening.     Impression: Status post left Total Knee Arthroplasty,Doing well postoperatively.        Plan:  She will continue to work aggressively on range of motion and strengthening: Natural history and expected course discussed. Questions answered.  Quad strengthening exercises. The patient will gradually transition to an outpatient physical therapy program. The patient will follow up with me in 4 weeks .

## 2013-01-28 NOTE — Telephone Encounter (Signed)
Pt states she's been on HCTZ forever, but when she went into hosp for surgery, bp was too low so took her off// this am her PT took a reading and it was 132/78 so she feels she needs to be back on it// do you agree? If so, she needs Rx sent to [facility] on [address]-----Rebecca Horn [phone]

## 2013-01-28 NOTE — Telephone Encounter (Signed)
Ok'd per Dr. Benjaman Pott   Called patient to let her know, spoke with her husband Jomarie Longs and let him know that it's at [facility] pharmacy [facility])

## 2013-02-01 MED ORDER — OXYCODONE-ACETAMINOPHEN 5-325 MG PO TABS
5-325 MG | ORAL_TABLET | Freq: Four times a day (QID) | ORAL | Status: DC | PRN
Start: 2013-02-01 — End: 2013-02-06

## 2013-02-01 NOTE — Telephone Encounter (Signed)
Left total knee replacement on 01/15/13 by Dr. Berneda Rose. Please advise if refill is ok.

## 2013-02-01 NOTE — Telephone Encounter (Signed)
Luretha RuedDianne S Radu is calling for a refill of the following medication: percocet  Last filled: 01/25/13  Last OV: 01/28/13  Next OV: 02/25/13    The patients preferred pharmacy was confirmed and the patient is aware that a response to the request may take up to 24 hours, and to check with the pharmacy prior to calling the office back.       Please call pt advise  [phone]

## 2013-02-01 NOTE — Telephone Encounter (Signed)
Percocet re-ordered in Dr. Scarlette Calico absence on vacation. JG

## 2013-02-04 NOTE — Telephone Encounter (Signed)
It could. She very well could develop an overuse injury. Without seeing it though I cannot tell her for sure. Make sure she is icing it 2-3 times a day. FU thurs/Fri INB

## 2013-02-04 NOTE — Telephone Encounter (Signed)
Pt had knee surgery on 4/8 and has been using can w/ her R hand a lot to aid in walking// on Friday noticed R elbow and R forearm very sore and now notices knot under skin on inside of forearm near elbow// about the size of a quarter, feels hard, painful/tender to touch, no redness, no warmth to the touch// affected area is alittle swollen// has been using ice for swelling// could this be from overuse of arm because she's using her cane a lot? Matrice [phone]-----------[facility] on [address]// penicillin allergy

## 2013-02-04 NOTE — Telephone Encounter (Signed)
dwp  Set up appt at 5

## 2013-02-06 MED ORDER — LEVOFLOXACIN 500 MG PO TABS
500 MG | ORAL_TABLET | Freq: Every day | ORAL | Status: AC
Start: 2013-02-06 — End: 2013-02-13

## 2013-02-06 MED ORDER — FERROUS SULFATE 324 (65 FE) MG PO TBEC
324 (65 Fe) MG | ORAL_TABLET | Freq: Two times a day (BID) | ORAL | Status: DC
Start: 2013-02-06 — End: 2013-04-16

## 2013-02-06 MED ORDER — OXYCODONE-ACETAMINOPHEN 5-325 MG PO TABS
5-325 MG | ORAL_TABLET | Freq: Four times a day (QID) | ORAL | Status: DC | PRN
Start: 2013-02-06 — End: 2013-03-07

## 2013-02-06 NOTE — Patient Instructions (Signed)
Warm compresses  Refer to Dr. Nehemiah Massed

## 2013-02-06 NOTE — Telephone Encounter (Signed)
[name] is calling for a refill of the following medication: percocet  Last filled: 02/01/13  Last OV: 01/28/13  Next OV: 02/25/13

## 2013-02-06 NOTE — Progress Notes (Signed)
Patient understands medication purpose, goal and dosage.

## 2013-02-06 NOTE — Telephone Encounter (Signed)
Wanting percocet

## 2013-02-06 NOTE — Telephone Encounter (Signed)
Rebecca Horn is calling for a refill of the following medication: percocet  Last filled: 02/01/13  Last OV: 01/28/13  Next OV: 02/25/13    The patients preferred pharmacy was confirmed and the patient is aware that a response to the request may take up to 24 hours, and to check with the pharmacy prior to calling the office back.

## 2013-02-07 NOTE — Telephone Encounter (Signed)
Thank you so much for update Let Dr Berneda Rose know.

## 2013-02-07 NOTE — Telephone Encounter (Signed)
FYI: Dr Efraim Kaufmann [facility] office called to let Dr Berneda Rose know that Dr Benjaman Pott filled pt's Percocet script yesterday so we didn't need to fill this for the pt

## 2013-02-18 NOTE — Telephone Encounter (Signed)
Back to work

## 2013-02-18 NOTE — Telephone Encounter (Signed)
Needs a refill of her medicine  Percocet [facility] [address]

## 2013-02-18 NOTE — Telephone Encounter (Signed)
Needs to also talk to someone returning to work

## 2013-02-19 NOTE — Telephone Encounter (Signed)
Talked to patient advised her to wait till the appt on Monday for restrictions for working.  [name] wants something for pain she has 3 more days of Percocet.

## 2013-02-20 NOTE — Telephone Encounter (Signed)
Advised pt and pharmacy

## 2013-02-24 NOTE — Progress Notes (Signed)
Subjective:      Patient ID: Rebecca Horn is a 62 y.o. female.    HPIComplaining of mass left arm. She has noted an increase in size over the last 48 hours    Review of Systems   Constitutional: Negative for activity change, appetite change and fatigue.   Skin: Negative for color change, rash and wound.   Neurological: Negative for weakness.       Objective:   Physical Exam   Vitals reviewed.  Constitutional: She is oriented to person, place, and time. She appears well-developed and well-nourished. No distress.   HENT:   Head: Normocephalic and atraumatic.   Eyes: Conjunctivae are normal. Pupils are equal, round, and reactive to light.   Cardiovascular: Normal rate, regular rhythm and normal heart sounds.    Pulmonary/Chest: Effort normal and breath sounds normal.   Neurological: She is alert and oriented to person, place, and time.   Skin: Skin is warm and intact. No abrasion, no burn, no ecchymosis, no petechiae and no rash noted. She is not diaphoretic.        1.5 raised area. Non-pulsatile. Fluctuant without erythema or streaking. Soft. Non-pustular in appearance       Assessment:      1. Hematoma  External Referral To Vascular Surgery   2. Anemia              Plan:      Orders Placed This Encounter   Procedures   ??? External Referral To Vascular Surgery     Referral Priority:  Routine     Referral Type:  Consult for Advice and Opinion     Referral Reason:  Specialty Services Required     Requested Specialty:  Vascular Surgery     Number of Visits Requested:  1

## 2013-02-25 NOTE — Progress Notes (Signed)
Rebecca Horn  [id number]  Feb 25, 2013    Chief Complaint   Patient presents with   ??? Post-Op Check     Lt TKA 01-15-13              History: The patient is now 6 weeks status post left total knee arthroplasty.  She is doing rather well.  She continues to have intermittent pain.  She is participating in therapy.      The patient's  past medical history, medications, allergies,  family history, social history, and review of systems have been reviewed, and dated and are recorded in the chart.    Resp 16   Ht 5' 2" (1.575 m)   Wt 231 lb (104.781 kg)   BMI 42.24 kg/m2    Physical: Rebecca Horn appears well, she is in no apparent distress, she demonstrates appropriate mood & affect. She is alert and oriented to person, place and time. She has mild swelling. There is No evidence of DVT seen on physical exam.. She is neurovascularly intact distally. Range of motion is from 0 degrees to 120 degrees. The incision is  clean, dry and intact and without erythema. Strength in the knee is 4+/5. There is no instability with varus and valgus stressing of the knee. There is no pain with range of motion of the hips.         Impression: Status post left Total Knee Arthroplasty,Doing well postoperatively.        Plan:  She will continue to work aggressively on range of motion and strengthening: Natural history and expected course discussed. Questions answered.  Quad strengthening exercises. The patient will continue the outpatient physical therapy program. The patient will follow up with me in 6 weeks.  The patient clearly is not able to return to work at this point.  She may return to work at full duty in 2 weeks.

## 2013-02-27 NOTE — Telephone Encounter (Signed)
Want norco

## 2013-02-27 NOTE — Telephone Encounter (Signed)
Rebecca Horn is calling for a refill of the following medication: Norco 5/325 mg  Last filled: ?  Last OV: 02/25/13  Next OV: 04/08/13  Patient states she will be out of the medication by the weekend, wants to make sure the refill is sent in before the en of Friday 03/01/13    The patients preferred pharmacy was confirmed and the patient is aware that a response to the request may take up to 24 hours, and to check with the pharmacy prior to calling the office back.

## 2013-02-27 NOTE — Telephone Encounter (Signed)
Called pharm

## 2013-03-05 NOTE — Communication Body (Signed)
Rebecca Horn  [id number]  Feb 25, 2013    Chief Complaint   Patient presents with   ??? Post-Op Check     Lt TKA 01-15-13              History: The patient is now 6 weeks status post left total knee arthroplasty.  She is doing rather well.  She continues to have intermittent pain.  She is participating in therapy.      The patient's  past medical history, medications, allergies,  family history, social history, and review of systems have been reviewed, and dated and are recorded in the chart.    Resp 16   Ht 5\' 2"  (1.575 m)   Wt 231 lb (104.781 kg)   BMI 42.24 kg/m2    Physical: Rebecca Horn appears well, she is in no apparent distress, she demonstrates appropriate mood & affect. She is alert and oriented to person, place and time. She has mild swelling. There is No evidence of DVT seen on physical exam.. She is neurovascularly intact distally. Range of motion is from 0 degrees to 120 degrees. The incision is  clean, dry and intact and without erythema. Strength in the knee is 4+/5. There is no instability with varus and valgus stressing of the knee. There is no pain with range of motion of the hips.         Impression: Status post left Total Knee Arthroplasty,Doing well postoperatively.        Plan:  She will continue to work aggressively on range of motion and strengthening: Natural history and expected course discussed. Questions answered.  Quad strengthening exercises. The patient will continue the outpatient physical therapy program. The patient will follow up with me in 6 weeks.  The patient clearly is not able to return to work at this point.  She may return to work at full duty in 2 weeks.

## 2013-03-07 NOTE — Patient Instructions (Addendum)
Patient Self-Management Goal for Chronic Condition  Goal: I will take all medications as prescribed by my doctor, and I will call the office if I am having any medication problems.  Barriers to success: none  Plan for overcoming my barriers: N/A     Confidence: 9/10  Date goal set: 03/07/2013  Date goal attained:     Toenail Fungus: After Your Visit  Your Care Instructions  A toenail that is infected by a fungus usually turns white or yellow. As the fungus spreads, the nail turns a darker color and gets thicker, and its edges start to turn ragged and crumble. A bad infection can cause toe pain, and the nail may pull away from the toe.  Toenails that are exposed to moisture and warmth a lot are more likely to get infected by a fungus. This can happen from wearing sweaty shoes often and from walking barefoot on shower floors.  It is hard to treat toenail fungus, and the infection can return after it has cleared up. But medicines can sometimes get rid of toenail fungus for good. If the infection is very bad, or if it causes a lot of pain, you may need to have the nail removed.  Follow-up care is a key part of your treatment and safety. Be sure to make and go to all appointments, and call your doctor if you are having problems. It???s also a good idea to know your test results and keep a list of the medicines you take.  How can you care for yourself at home?  ?? Take your medicines exactly as prescribed. Call your doctor if you have any problems with your medicine. You will get more details on the specific medicines your doctor prescribes.  ?? If your doctor gave you a cream or liquid to put on your toenail, use it exactly as directed.  ?? Wash your feet often, and wash your hands after touching your feet.  ?? Keep your toenails clean and dry. Dry your feet completely after you bathe and before you put on shoes and socks.  ?? Keep your toenails trimmed.  ?? Change socks often. Wear dry socks that absorb moisture.  ?? Do not go  barefoot in public places.  ?? Use a spray or powder that fights fungus on your feet and in your shoes.  ?? Do not pick at the skin around your nails.  ?? Do not use nail polish or fake nails on your toenails.  When should you call for help?  Call your doctor now or seek immediate medical care if:  ?? You have signs of infection, such as:  ?? Increased pain, swelling, warmth, or redness.  ?? Red streaks leading from the site.  ?? Pus draining from the site.  ?? A fever.  ?? You have new or increased toe pain.  Watch closely for changes in your health, and be sure to contact your doctor if:  ?? You do not get better as expected.   Where can you learn more?   Go to [email or web address] and sign in to your MyChart account. Enter D202 in the Search Health Information box to learn more about ???Toenail Fungus: After Your Visit.???    If you do not have an account, please click on the ???Sign Up Now??? link.     ?? 2006-2013 [employer]. Care instructions adapted under license by [facility]. This care instruction is for use with your licensed healthcare professional. If you have questions about a  medical condition or this instruction, always ask your healthcare professional. [employer] disclaims any warranty or liability for your use of this information.  Content Version: 9.9.209917; Last Revised: August 09, 2010

## 2013-03-07 NOTE — Progress Notes (Signed)
Patient understands medication purpose, goal and dosage.

## 2013-03-11 NOTE — Progress Notes (Signed)
Subjective:      Patient here for follow-up of elevated blood pressure.  She is not exercising and is not adherent to a low-salt diet.  Blood pressure is well controlled at home. Cardiac symptoms: none. Patient denies: chest pain, chest pressure/discomfort, exertional chest pressure/discomfort and palpitations. Cardiovascular risk factors: hypertension, obesity (BMI >= 30 kg/m2) and sedentary lifestyle. Use of agents associated with hypertension: none. History of target organ damage: none.    Patient's medications, allergies, past medical, surgical, social and family histories were reviewed and updated as appropriate.    Review of Systems  Pertinent items are noted in HPI.        Objective:      BP 121/76   Pulse 93   Ht 5\' 2"  (1.575 m)   Wt 224 lb 9.6 oz (101.878 kg)   BMI 41.07 kg/m2   SpO2 96%  General appearance: alert, appears stated age, cooperative and no distress  Neck: no carotid bruit, supple, symmetrical, trachea midline and thyroid not enlarged, symmetric, no tenderness/mass/nodules  Lungs: clear to auscultation bilaterally  Heart: regular rate and rhythm, S1, S2 normal, no murmur, click, rub or gallop  Extremities: extremities normal, atraumatic, no cyanosis or edema and swelling at recent OR site  Pulses: 2+ and symmetric      Assessment:      Hypertension, normal blood pressure . Evidence of target organ damage: none.      Plan:      Medication: no change.  Follow up: 6 months and as needed.

## 2013-03-14 NOTE — Telephone Encounter (Signed)
Called in med to pharm.

## 2013-03-14 NOTE — Telephone Encounter (Signed)
Rebecca Horn is calling for a refill of the following medication: Norco 5/325 mg 30 qty takes 2 a day   Last filled: 03/01/13  Pt states she has enough to last her through the weekend was just calling ahead for refill     The patients preferred pharmacy was confirmed and the patient is aware that a response to the request may take up to 24 hours, and to check with the pharmacy prior to calling the office back.

## 2013-04-08 NOTE — Progress Notes (Signed)
Rebecca Horn  [id number]  April 08, 2013    Chief Complaint   Patient presents with   ??? Follow-up     left knee DOS 01/15/13       History: The patient is here for followup regarding her left knee.  She is now 12 weeks status post left total knee arthroplasty.  She has returned to work full duty.  She denies any pain in the left knee.  She is having mild intermittent right knee pain.  She would like to have her right knee evaluated today.  She occasionally has pain in the right knee with stairs.  She currently is not taking any medication for the knees.  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  She denies any pain in the right hip.    The patient's  past medical history, medications, allergies,  family history, social history, and review of systems have been reviewed, and dated and are recorded in the chart.    Vitals:  Resp 16   Ht 5' 3" (1.6 m)   Wt 225 lb (102.059 kg)   BMI 39.87 kg/m2    Physical: [name] Matters appears well, she is in no apparent distress, she demonstrates appropriate mood & affect. She is alert and oriented to person, place and time. Examination of the right lower extremity reveals no pain with range of motion of the hip. She has mild tenderness to palpation about the joint line of the right knee. Range of motion is from 0 degrees to 125 degrees. Strength is 4+ to 5/5 for all muscle groups about the right knee. There is patellofemoral crepitus with range of motion of the right knee. Varus and valgus stressing of the knees reveals no evidence of instability. There is a small effusion in the right knee. Anterior drawer and Lachman are negative bilaterally.   Examination of the skin reveals no rashes, ulceration, or lesion, bilaterally in the lower extremities. Sensation to both lower extremities is grossly intact. Exam of both feet reveals pedal pulses intact and brisk cap refill. Patient is able to dorsiflex and wiggle all toes. Deep tendon reflexes  of the lower extremities are normal and symmetric.   Examination of the left knee reveals a well-healed anterior midline incision.  There is no evidence of erythema or warmth.  Range of motion of the left knee is from 0?? to 125??.  Strength is 5/5.  She is neurovascularly intact in the left lower extremity.  There is no evidence of instability with varus or valgus stressing of the left knee.  She has no pain with range of motion of the left hip.    X-rays: 3 views of the left knee and 4 views of the right knee were obtained in the office today.  The left total knee prosthesis is in excellent alignment.  There is no evidence of loosening or fracture.  X-rays of the right knee reveal moderate osteoarthritis.  The changes are most severe in the medial compartment and patellofemoral compartment.    Impression: #1 status post left total knee arthroplasty  2. Right knee osteoarthritis    Plan: At this time we will continue to treat the right knee conservatively.  If the patient develops pain, she will take over-the-counter anti-inflammatories.  She will continue her home exercises for the left knee.  If the patient develops severe right knee pain, we will consider an injection.  The patient will follow up in   6 months for the left knee and the right hip.  X-rays will be obtained of both at follow up.

## 2013-04-17 MED ORDER — TOVIAZ 8 MG PO TB24
8 MG | ORAL_TABLET | ORAL | Status: AC
Start: 2013-04-17 — End: ?

## 2013-04-17 MED ORDER — HYDROCHLOROTHIAZIDE 12.5 MG PO CAPS
12.5 MG | ORAL_CAPSULE | ORAL | Status: AC
Start: 2013-04-17 — End: ?

## 2013-04-17 MED ORDER — FERROUS SULFATE 324 (65 FE) MG PO TBEC
324 (65 Fe) MG | ORAL_TABLET | ORAL | Status: AC
Start: 2013-04-17 — End: ?

## 2013-04-17 NOTE — Telephone Encounter (Signed)
OK verbally per dr. schroer

## 2013-04-19 NOTE — Telephone Encounter (Signed)
dwp with pharmacy

## 2013-04-19 NOTE — Telephone Encounter (Signed)
[facility] [phone] called about her ferrous sulfate Rx qty? Directions don't match qty---- Please call pharmacy to clarify

## 2013-05-02 NOTE — Communication Body (Signed)
Rebecca Horn  [id number]  April 08, 2013    Chief Complaint   Patient presents with   ??? Follow-up     left knee DOS 01/15/13       History: The patient is here for followup regarding her left knee.  She is now 12 weeks status post left total knee arthroplasty.  She has returned to work full duty.  She denies any pain in the left knee.  She is having mild intermittent right knee pain.  She would like to have her right knee evaluated today.  She occasionally has pain in the right knee with stairs.  She currently is not taking any medication for the knees.  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  She denies any pain in the right hip.    The patient's  past medical history, medications, allergies,  family history, social history, and review of systems have been reviewed, and dated and are recorded in the chart.    Vitals:  Resp 16   Ht  (1.6 m)   Wt 225 lb (102.059 kg)   BMI 39.87 kg/m2    Physical: Rebecca Horn appears well, she is in no apparent distress, she demonstrates appropriate mood & affect. She is alert and oriented to person, place and time. Examination of the right lower extremity reveals no pain with range of motion of the hip. She has mild tenderness to palpation about the joint line of the right knee. Range of motion is from 0 degrees to 125 degrees. Strength is 4+ to 5/5 for all muscle groups about the right knee. There is patellofemoral crepitus with range of motion of the right knee. Varus and valgus stressing of the knees reveals no evidence of instability. There is a small effusion in the right knee. Anterior drawer and Lachman are negative bilaterally.   Examination of the skin reveals no rashes, ulceration, or lesion, bilaterally in the lower extremities. Sensation to both lower extremities is grossly intact. Exam of both feet reveals pedal pulses intact and brisk cap refill. Patient is able to dorsiflex and wiggle all toes. Deep tendon reflexes  of the lower extremities are normal and symmetric.   Examination of the left knee reveals a well-healed anterior midline incision.  There is no evidence of erythema or warmth.  Range of motion of the left knee is from 0?? to 125??.  Strength is 5/5.  She is neurovascularly intact in the left lower extremity.  There is no evidence of instability with varus or valgus stressing of the left knee.  She has no pain with range of motion of the left hip.    X-rays: 3 views of the left knee and 4 views of the right knee were obtained in the office today.  The left total knee prosthesis is in excellent alignment.  There is no evidence of loosening or fracture.  X-rays of the right knee reveal moderate osteoarthritis.  The changes are most severe in the medial compartment and patellofemoral compartment.    Impression: #1 status post left total knee arthroplasty  2. Right knee osteoarthritis    Plan: At this time we will continue to treat the right knee conservatively.  If the patient develops pain, she will take over-the-counter anti-inflammatories.  She will continue her home exercises for the left knee.  If the patient develops severe right knee pain, we will consider an injection.  The patient will follow up in  6 months for the left knee and the right hip.  X-rays will be obtained of both at follow up.

## 2013-05-31 MED ORDER — DICLOFENAC SODIUM 75 MG PO TBEC
75 MG | ORAL_TABLET | ORAL | Status: DC
Start: 2013-05-31 — End: 2013-07-14

## 2013-07-16 MED ORDER — DICLOFENAC SODIUM 75 MG PO TBEC
75 MG | ORAL_TABLET | ORAL | Status: DC
Start: 2013-07-16 — End: 2013-08-22

## 2013-08-13 ENCOUNTER — Encounter

## 2013-08-23 MED ORDER — DICLOFENAC SODIUM 75 MG PO TBEC
75 MG | ORAL_TABLET | ORAL | Status: AC
Start: 2013-08-23 — End: ?

## 2014-04-07 NOTE — ED Provider Notes (Signed)
of [facility]  [facility]  [address]  [place] [id number]  Dept: [phone]  Loc: [phone]  eMERGENCY dEPARTMENT eNCOUnter        CHIEF COMPLAINT    Chief Complaint   Patient presents with   ??? Knee Pain     R knee x3 weeks worsening tonight.       HPI    Rebecca Horn is a 63 y.o. female who presents with right knee and right hip pain. Mechanism of injury was a mechanical fall that occurred 2 weeks ago.  The onset of the pain was acute.  The duration has been constant since the onset.  The severity of the pain is 2/10.  The pain worsens with ambulation. She states she has a previous right hip replacement per Dr. Berneda RoseMoran and is worried she may have displaced her hardware. She states she has been fully ambulatory over the last 2 weeks but today her right knee pain progressed in intensity. She denies hitting her head or loss of consciousness. She denies abdominal pain or discomfort. She denies any current modifying or alleviating factors.    REVIEW OF SYSTEMS    General: No fever or chills  Skin: No Rashes or redness of the skin  GU: No dysuria or hematuria  See HPI for further details.    PAST MEDICAL & SURGICAL HISTORY    Past Medical History   Diagnosis Date   ??? Unspecified sleep apnea    ??? Hypertension    ??? Menopause    ??? Osteopenia    ??? Uterine fibroid    ??? Osteoarthritis of right hip 08/22/2012   ??? GERD (gastroesophageal reflux disease)    ??? Nausea & vomiting      slight nausea     Past Surgical History   Procedure Laterality Date   ??? Tonsillectomy and adenoidectomy     ??? Foot surgery       had a bone spur on left foot   ??? Fracture surgery       right ankle   ??? Total hip arthroplasty Right 10/16/2012     Dr Berneda RoseMoran   ??? Total knee arthroplasty Left 01/15/2013     Dr Berneda RoseMoran       CURRENT MEDICATIONS    Current Outpatient Rx   Name  Route  Sig  Dispense  Refill   ??? HYDROcodone-acetaminophen (NORCO) 5-325 MG per tablet    Oral    Take 1 tablet by mouth every 8 hours  as needed for Pain Sedation precautions please    10 tablet    0     ??? cyclobenzaprine (FLEXERIL) 10 MG tablet    Oral    Take 1 tablet by mouth 2 times daily as needed for Muscle spasms    10 tablet    0     ??? diclofenac (VOLTAREN) 75 MG EC tablet        TAKE ONE TABLET BY MOUTH TWICE A DAY    60 tablet    1     ??? ferrous sulfate 324 (65 FE) MG EC tablet        TAKE ONE TABLET BY MOUTH TWICE A DAY WITH MEALS    30 tablet    2     ??? TOVIAZ 8 MG TB24        TAKE ONE TABLET BY MOUTH EVERY DAY    30 tablet    1     ???  hydrochlorothiazide (MICROZIDE) 12.5 MG capsule        TAKE ONE CAPSULE BY MOUTH EVERY DAY    30 capsule    1     ??? omeprazole (PRILOSEC) 20 MG capsule    Oral    Take 20 mg by mouth daily.             ??? calcium carbonate-vitamin D (CALCIUM + D) 600-200 MG-UNIT TABS    Oral    Take 1 capsule by mouth.             ??? Omega-3 Fatty Acids (FISH OIL) 1000 MG CAPS    Oral    Take 1,000 mg by mouth daily.             ??? DISCONTD: Fesoterodine Fumarate ER (TOVIAZ) 8 MG TB24    Oral    Take 8 mg by mouth Daily.    30 tablet    3         ALLERGIES    Allergies   Allergen Reactions   ??? Penicillins Rash       SOCIAL & FAMILY HISTORY    History     Social History   ??? Marital Status: Married     Spouse Name: N/A     Number of Children: N/A   ??? Years of Education: N/A     Social History Main Topics   ??? Smoking status: Never Smoker    ??? Smokeless tobacco: Never Used   ??? Alcohol Use: Yes      Comment: occassionally   ??? Drug Use: No   ??? Sexual Activity:     Partners: Male     Other Topics Concern   ??? None     Social History Narrative     History reviewed. No pertinent family history.      PHYSICAL EXAM    VITAL SIGNS: BP 148/87    Pulse 63    Temp(Src) 97.5 ??F (36.4 ??C) (Oral)    Resp 16    Ht 5\' 2"  (1.575 m)    Wt 235 lb (106.595 kg)    BMI 42.97 kg/m2      SpO2 98%   Constitutional:  Well developed, Appears comfortable  HENT:  Atraumatic, Moist mucous membranes  NECK- normal range of motion,  supple   Respiratory:  No  retractions; clear throughout   Cardiovascular:  No JVD; no murmurs, gallops, clicks   Musculoskeletal:  The right knee exam reveals full flexion and extension of right knee with and without gravity.  Extensor mechanism is intact (Patient is able to extend the leg against gravity, demonstrating a quadriceps tendon and the patella tendon are intact). Seen ambulating with steady gait in the ED.  Vascular: DP pulses 2+ on the same side as the knee pain (distal to the affected knee).  Integument:  Well hydrated, no rash   Neurologic:  Awake and alert, no slurred speech   Psychiatric: Cooperative, pleasant affect    RADIOLOGY/PROCEDURES    XR KNEE RIGHT LIMITED    Final Result: IMPRESSION: Mild osteoarthritis.       XR HIP RIGHT STANDARD    Final Result: IMPRESSION: No acute radiographic abnormality.           ED COURSE & MEDICAL DECISION MAKING    Pertinent Imaging studies reviewed and interpreted. (See EMR for details)  See electronic record for medications ordered.  Knee immobilizer and crutches ordered.  Filed Vitals:    04/07/14 2139  BP: 148/87   Pulse: 63   Temp: 97.5 ??F (36.4 ??C)   TempSrc: Oral   Resp: 16   Height: 5\' 2"  (1.575 m)   Weight: 235 lb (106.595 kg)   SpO2: 98%     FINAL IMPRESSION    1. Knee strain, right, initial encounter        PLAN  Immobilization, partial weightbearing with crutches, outpatient medications, orthopedic follow-up.  She was placed in a Velcro knee immobilizer and provided crutches. She was encouraged to ice, elevate, and remain in active until seen by orthopedics. Her x-ray findings were benign. She was discharged on Norco and Flexeril for discomfort. ALL questions and concerns were addressed.    (Please note that this note was completed with a voice recognition program.  Every attempt was made to edit the dictations, but inevitably there remain words that are mis-transcribed.)    Electronically signed by: Hendricks LimesLee Underwood Herfel, MD, 04/07/2014  10:10 PM    Cristela BlueHeather M Azim Gillingham,  CNP  04/07/14 2211

## 2014-04-07 NOTE — Discharge Instructions (Signed)
Patellar Tendinitis (Jumper's Knee): Exercises  Your Care Instructions  Here are some examples of typical rehabilitation exercises for your condition. Start each exercise slowly. Ease off the exercise if you start to have pain.  Your doctor or physical therapist will tell you when you can start these exercises and which ones will work best for you.  How to do the exercises  Straight-leg raises to the front    1. Lie on your back with your good knee bent so that your foot rests flat on the floor. Your affected leg should be straight. Make sure that your low back has a normal curve. You should be able to slip your hand in between the floor and the small of your back, with your palm touching the floor and your back touching the back of your hand.  2. Tighten the thigh muscles in your affected leg by pressing the back of your knee flat down to the floor. Hold your knee straight.  3. Keeping the thigh muscles tight and your leg straight, lift your leg up so that your heel is about 12 inches off the floor.  4. Hold for about 6 seconds, then lower your leg slowly. Rest for up to 10 seconds between repetitions.  5. Repeat 8 to 12 times.  Short-arc quad    1. Lie on your back with your knees bent over a foam roll or large rolled-up towel and your heels on the floor.  2. Lift the lower part of your affected leg until your leg is straight. Keep the back of your knee on the foam roll or towel.  3. Hold your leg straight for about 6 seconds, then slowly bend your knee and lower your heel back to the floor. Rest for up to 10 seconds between repetitions.  4. Repeat 8 to 12 times.  Half-squat with knees and feet turned out to the side    1. Stand with your feet about shoulder-width apart and turned out to the side about 45 degrees.  2. Keep your back straight, and tighten your buttocks.  3. Slowly bend your knees to lower your body about one-quarter of the way down toward the floor. Try to keep your back straight at all times,  and do not let your pelvis tilt forward or your knees extend beyond the tip of your toes.  4. Repeat 8 to 12 times.  Step up    1. Place a single-step footstool on the floor. If you do not have a footstool, you can use a thick book, such as a phone book, a dictionary, or an encyclopedia. If you are not steady on your feet, hold on to a chair, counter, or wall while you do this exercise.  2. Keeping your back straight, step up with your affected leg. Try not to push off your back leg as you step up. Only use your affected leg to bring you up on to the step. Then lift your other leg on to the step.  3. Move back to the starting position, with both feet on the floor.  4. Repeat 8 to 12 times.  Step down    1. Stand on a single-step footstool. If you do not have a footstool, you can use a thick book, such as a phone book, a dictionary, or an encyclopedia. If you are not steady on your feet, hold on to a chair, counter, or wall while you do this exercise.  2. Slowly step down with your good leg, allowing   your heel to lightly touch the floor. As you step down, try to keep your affected knee moving in a straight line toward your middle toe.  3. Move back to the starting position, with both feet on the footstool or book.  4. Repeat 8 to 12 times.  Terminal knee extension    1. Tie the ends of an exercise band together to form a loop. Attach one end of the loop to a secure object, or shut a door on it to hold it in place. (Or you can have someone hold one end of the loop to provide resistance.)  2. Loop the other end of the exercise band around the knee of your affected leg. Keep that leg somewhat bent at the knee.  3. Put your good leg about a step behind your affected leg. Then slowly straighten your affected leg by tightening the thigh muscles of that leg.  4. Hold for about 6 seconds, then return to the starting position, with your knee somewhat bent.  5. Rest for up to 10 seconds.  6. Repeat 8 to 12 times.  Follow-up  care is a key part of your treatment and safety. Be sure to make and go to all appointments, and call your doctor if you are having problems. It's also a good idea to know your test results and keep a list of the medicines you take.   Where can you learn more?   Go to [email or web address] and sign in to your MyChart account. Enter U166 in the Search Health Information box to learn more about "Patellar Tendinitis (Jumper's Knee): Exercises."    If you do not have an account, please click on the "Sign Up Now" link.      2006-2015 [employer]. Care instructions adapted under license by [facility]. This care instruction is for use with your licensed healthcare professional. If you have questions about a medical condition or this instruction, always ask your healthcare professional. [employer], Inc[employer] disclaims any warranty or liability for your use of this information.  Content Version: 10.4.390249; Current as of: August 23, 2013

## 2014-04-07 NOTE — ED Provider Notes (Signed)
Luretha RuedDianne S Staff is a 63 y.o. female who presents with a complaint of No chief complaint on file.  .She's been having trouble with the right knee for the past week or so and today was walking and felt a pop and now has worsening pain in the right knee    On my exam:  Vital Signs: There were no vitals taken for this visit.  General: nontoxic morbidly obese  Right knee has some tenderness is no warmth or effusion noted  XR KNEE RIGHT LIMITED    (Results Pending)       Labs Reviewed - No data to display      This patient was seen by the mid-level provider.  I have seen and examined the patient face to face, agree with the workup, evaluation, management and diagnosis.  Care plan has been discussed.              Hendricks LimesLee Underwood Shawana Knoch, MD  04/07/14 2118

## 2014-04-07 NOTE — ED Notes (Signed)
Pt c/o R knee soreness and tighness x3 weeks. Pt states she herd a "pop" tonight and immediately had shooting pain and is now unable to bare weight on leg.     Buckner Maltarystal Busby, [place]  04/07/14 [phone]

## 2014-04-08 ENCOUNTER — Inpatient Hospital Stay: Admit: 2014-04-08 | Discharge: 2014-04-08 | Attending: Emergency Medicine

## 2014-04-08 MED ORDER — HYDROCODONE-ACETAMINOPHEN 5-325 MG PO TABS
5-325 MG | ORAL_TABLET | Freq: Three times a day (TID) | ORAL | Status: AC | PRN
Start: 2014-04-08 — End: ?

## 2014-04-08 MED ORDER — CYCLOBENZAPRINE HCL 10 MG PO TABS
10 MG | ORAL_TABLET | Freq: Two times a day (BID) | ORAL | Status: AC | PRN
Start: 2014-04-08 — End: ?

## 2014-04-08 MED ADMIN — HYDROcodone-acetaminophen (NORCO) 5-325 MG per tablet 1 tablet: 1 | ORAL | @ 02:00:00 | NDC 00904641961

## 2014-04-08 MED FILL — HYDROCODONE-ACETAMINOPHEN 5-325 MG PO TABS: 5-325 MG | ORAL | Qty: 1

## 2014-04-09 MED ORDER — DICLOFENAC SODIUM 75 MG PO TBEC
75 MG | ORAL_TABLET | Freq: Two times a day (BID) | ORAL | Status: DC
Start: 2014-04-09 — End: 2014-05-31

## 2014-04-09 NOTE — Progress Notes (Signed)
Luretha RuedDianne S Chaloux  [id number]  April 09, 2014    Chief Complaint   Patient presents with   ??? Post-Op Check     S/P left knee TKA; dos 01/15/13.   ??? Hip Pain     S/P right THA; 10/16/12.   ??? Knee Pain     F/U right knee pain.       History: The patient is a 63 year old female who is well-known to me.  I performed a right total hip arthroplasty on 10/16/2012.  I performed a left total knee arthroplasty on 01/15/2013.  She did extremely well.  She is having no pain in either joint.  She was walking into her house and she developed severe right knee pain.  This occurred approximately a week ago.  The pain has been rather persistent.  She has received injections in the remote past.    The patient's  past medical history, medications, allergies,  family history, social history, and review of systems have been reviewed, and dated and are recorded in the chart.    Vitals:  Resp 17    Ht 5\' 2"  (1.575 m)    Wt 230 lb (104.327 kg)    BMI 42.06 kg/m2       Physical: Ms. Luretha RuedDianne S Kroeker appears well, she is in no apparent distress, she demonstrates appropriate mood & affect. She is alert and oriented to person, place and time. Examination of the right lower extremity reveals no pain with range of motion of the hip. She has generalized tenderness to palpation about the joint line of the right knee. Range of motion is from 0 degrees to 120 degrees. Strength is 4+ to 5/5 for all muscle groups about the right knee. There is patellofemoral crepitus with range of motion of the right knee. Varus and valgus stressing of the knees reveals no evidence of instability. There is a small effusion in the right knee. Anterior drawer and Lachman are negative bilaterally.   Examination of the skin reveals no rashes, ulceration, or lesion, bilaterally in the lower extremities. Sensation to both lower extremities is grossly intact. Exam of both feet reveals pedal pulses intact and brisk cap refill. Patient is able to dorsiflex and wiggle all toes. Deep  tendon reflexes of the lower extremities are normal and symmetric.     X-rays: 2 views of the right knee nonweightbearing obtained in the emergency room were extensively reviewed.  The x-rays confirm moderate to severe medial joint space narrowing.  2 views of the right hip and an AP pelvis obtained in the emergency room were extensively reviewed.  The right total hip arthroplasty is in excellent alignment.  There is no evidence of loosening or subsidence.  3 views of the left knee obtained today were extensively reviewed.  The prosthesis is well aligned.  There is no evidence of loosening or subsidence.    Impression: Right knee osteoarthritis    Plan: At this time, the right knee was injected under sterile conditions. After a Betadine prep of the joint, 3 cc of 0.25% Marcaine and 2 cc of 40 mg Depo-medrol were injected into the knee. The patient tolerated the injection rather well. The patient was instructed to follow up for any signs of infection.Natural history and expected course discussed. Questions answered.  Reduction in offending activity.  OTC analgesics as needed. The patient will follow up with me in 2 months.  The patient will ultimately require total knee arthroplasty.

## 2014-05-02 NOTE — Communication Body (Signed)
Rebecca Horn  [id number]  Feb 25, 2013    Chief Complaint   Patient presents with   ??? Post-Op Check     Lt TKA 01-15-13              History: The patient is now 6 weeks status post left total knee arthroplasty.  She is doing rather well.  She continues to have intermittent pain.  She is participating in therapy.      The patient's  past medical history, medications, allergies,  family history, social history, and review of systems have been reviewed, and dated and are recorded in the chart.    Resp 16   Ht 5\' 2"  (1.575 m)   Wt 231 lb (104.781 kg)   BMI 42.24 kg/m2    Physical: [name] appears well, she is in no apparent distress, she demonstrates appropriate mood & affect. She is alert and oriented to person, place and time. She has mild swelling. There is No evidence of DVT seen on physical exam.. She is neurovascularly intact distally. Range of motion is from 0 degrees to 120 degrees. The incision is  clean, dry and intact and without erythema. Strength in the knee is 4+/5. There is no instability with varus and valgus stressing of the knee. There is no pain with range of motion of the hips.         Impression: Status post left Total Knee Arthroplasty,Doing well postoperatively.        Plan:  She will continue to work aggressively on range of motion and strengthening: Natural history and expected course discussed. Questions answered.  Quad strengthening exercises. The patient will continue the outpatient physical therapy program. The patient will follow up with me in 6 weeks.  The patient clearly is not able to return to work at this point.  She may return to work at full duty in 2 weeks.

## 2014-06-02 MED ORDER — DICLOFENAC SODIUM 75 MG PO TBEC
75 MG | ORAL_TABLET | ORAL | Status: DC
Start: 2014-06-02 — End: 2014-06-29

## 2014-06-30 MED ORDER — DICLOFENAC SODIUM 75 MG PO TBEC
75 MG | ORAL_TABLET | ORAL | Status: DC
Start: 2014-06-30 — End: 2014-08-25

## 2014-08-27 MED ORDER — DICLOFENAC SODIUM 75 MG PO TBEC
75 MG | ORAL_TABLET | ORAL | Status: DC
Start: 2014-08-27 — End: 2014-10-01

## 2014-10-01 MED ORDER — DICLOFENAC SODIUM 75 MG PO TBEC
75 MG | ORAL_TABLET | ORAL | Status: DC
Start: 2014-10-01 — End: 2014-11-06

## 2014-11-07 MED ORDER — DICLOFENAC SODIUM 75 MG PO TBEC
75 MG | ORAL_TABLET | ORAL | Status: AC
Start: 2014-11-07 — End: ?

## 2014-12-17 ENCOUNTER — Ambulatory Visit
Admit: 2014-12-17 | Discharge: 2014-12-17 | Payer: PRIVATE HEALTH INSURANCE | Attending: Orthopaedic Surgery | Primary: Family Medicine

## 2014-12-17 DIAGNOSIS — M1711 Unilateral primary osteoarthritis, right knee: Secondary | ICD-10-CM

## 2014-12-17 MED ORDER — DICLOFENAC SODIUM 75 MG PO TBEC
75 | ORAL_TABLET | Freq: Two times a day (BID) | ORAL | Status: DC
Start: 2014-12-17 — End: 2015-02-23

## 2014-12-17 NOTE — Progress Notes (Signed)
Rebecca Horn  [id number]  December 17, 2014    Chief Complaint   Patient presents with   ??? Follow-up     right knee        History: [name] is a pleasant 64 y.o. old female here for follow-up regarding her right knee.  She has previously had a Left TKA on 01/15/13 and a Right THA on 10/16/12.  She had a steroid injection in her right knee in July of last year.  She reports great relief and has no pain today.  She is still taking the diclofenac once or twice daily and feels it is helping.     The patient's past medical history, medications, allergies, family history, social history, and review of systems have been reviewed, and dated and are recorded in the chart.    Vitals:  Ht 5' 1.81" (1.57 m)   Wt 236 lb (107.049 kg)   BMI 43.43 kg/m2    Physical: [name] appears well, she is in no apparent distress, she demonstrates appropriate mood & affect. She is alert and oriented to person, place and time. Examination of the right lower extremity reveals no pain with range of motion of the hip. She has no tenderness to palpation about the joint line of the right knee. Range of motion is from 0 degrees to 125 degrees. Strength is 4+ to 5/5 for all muscle groups about the right knee. There is mild patellofemoral crepitus with range of motion of the right knee. Varus and valgus stressing of the knees reveals no evidence of instability. There is a no effusion in the right knee. Anterior drawer and Lachman are negative bilaterally.   Examination of the skin reveals no rashes, ulceration, or lesion, bilaterally in the lower extremities. Sensation to both lower extremities is grossly intact. Exam of both feet reveals pedal pulses intact and brisk cap refill. Patient is able to dorsiflex and wiggle all toes. Deep tendon reflexes of the lower extremities are normal and symmetric.     Impression: right knee osteoarthritis.    Plan: She will continue her home exercises.  A refill of her diclofenac was sent to her  pharmacy.  She will ultimately require a right TKA.  Follow-up will be on a prn basis.

## 2014-12-23 NOTE — Communication Body (Signed)
Rebecca Horn  [id number]  December 17, 2014    Chief Complaint   Patient presents with   ??? Follow-up     right knee        History: [name] is a pleasant 64 y.o. old female here for follow-up regarding her right knee.  She has previously had a Left TKA on 01/15/13 and a Right THA on 10/16/12.  She had a steroid injection in her right knee in July of last year.  She reports great relief and has no pain today.  She is still taking the diclofenac once or twice daily and feels it is helping.     The patient's past medical history, medications, allergies, family history, social history, and review of systems have been reviewed, and dated and are recorded in the chart.    Vitals:  Ht 5' 1.81" (1.57 m)   Wt 236 lb (107.049 kg)   BMI 43.43 kg/m2    Physical: Rebecca Horn appears well, she is in no apparent distress, she demonstrates appropriate mood & affect. She is alert and oriented to person, place and time. Examination of the right lower extremity reveals no pain with range of motion of the hip. She has no tenderness to palpation about the joint line of the right knee. Range of motion is from 0 degrees to 125 degrees. Strength is 4+ to 5/5 for all muscle groups about the right knee. There is mild patellofemoral crepitus with range of motion of the right knee. Varus and valgus stressing of the knees reveals no evidence of instability. There is a no effusion in the right knee. Anterior drawer and Lachman are negative bilaterally.   Examination of the skin reveals no rashes, ulceration, or lesion, bilaterally in the lower extremities. Sensation to both lower extremities is grossly intact. Exam of both feet reveals pedal pulses intact and brisk cap refill. Patient is able to dorsiflex and wiggle all toes. Deep tendon reflexes of the lower extremities are normal and symmetric.     Impression: right knee osteoarthritis.    Plan: She will continue her home exercises.  A refill of her diclofenac was sent to her  pharmacy.  She will ultimately require a right TKA.  Follow-up will be on a prn basis.

## 2015-02-24 MED ORDER — DICLOFENAC SODIUM 75 MG PO TBEC
75 | ORAL_TABLET | ORAL | Status: DC
Start: 2015-02-24 — End: 2015-04-06

## 2015-04-07 MED ORDER — DICLOFENAC SODIUM 75 MG PO TBEC
75 | ORAL_TABLET | ORAL | 1 refills | Status: DC
Start: 2015-04-07 — End: 2015-06-11

## 2015-06-12 MED ORDER — DICLOFENAC SODIUM 75 MG PO TBEC
75 | ORAL_TABLET | ORAL | 0 refills | Status: DC
Start: 2015-06-12 — End: 2015-07-12

## 2015-06-12 NOTE — Telephone Encounter (Signed)
Please ensure her PCP is aware sh has continued on NSAIDs

## 2015-07-13 MED ORDER — DICLOFENAC SODIUM 75 MG PO TBEC
75 | ORAL_TABLET | ORAL | 0 refills | Status: AC
Start: 2015-07-13 — End: ?

## 2020-05-19 IMAGING — MR MRI LUMBAR SPINE WITHOUT CONTRAST
4 of 6 series · 23 of 48 positions shown · non-contrast
Comparison: None

MRI LUMBAR SPINE WITHOUT CONTRAST, 05/19/2020 [DATE]: 
CLINICAL INDICATION: Pain from bottom of the rib cage down across back and out 
to the right outer hip.
TECHNIQUE: Multiplanar, multiecho position MR images of the lumbar spine were 
performed without contrast.

[Series 101: survey · axial · 10.0mm · 1.39mm/px · z∈[+15,+199]mm · 3 of 14 slices shown]
[im 3/14]
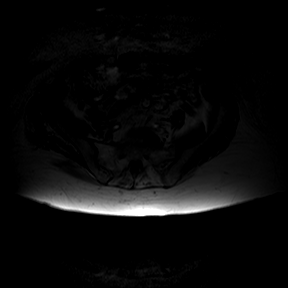
[im 8/14]
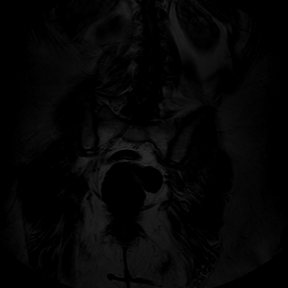
[im 14/14]
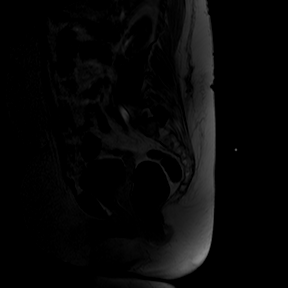

[Series 301: t1w tse sag · sagittal · 4.0mm · 0.50mm/px · 3 of 17 slices shown]
[im 4/17]
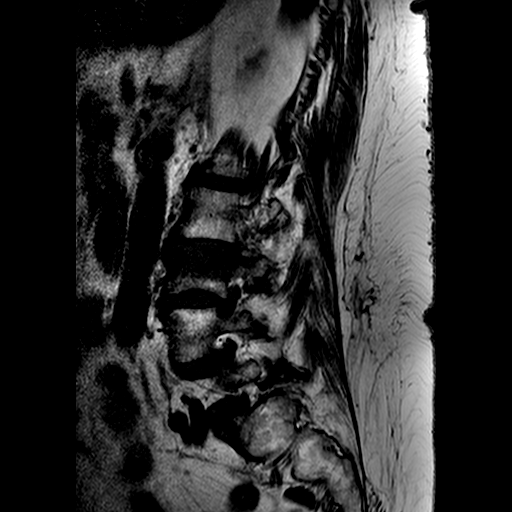
[im 10/17]
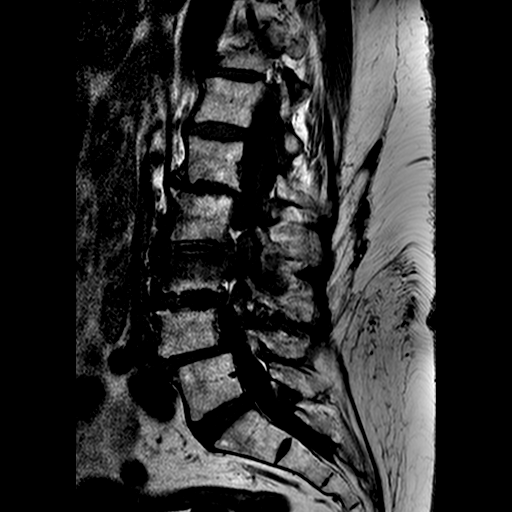
[im 17/17]
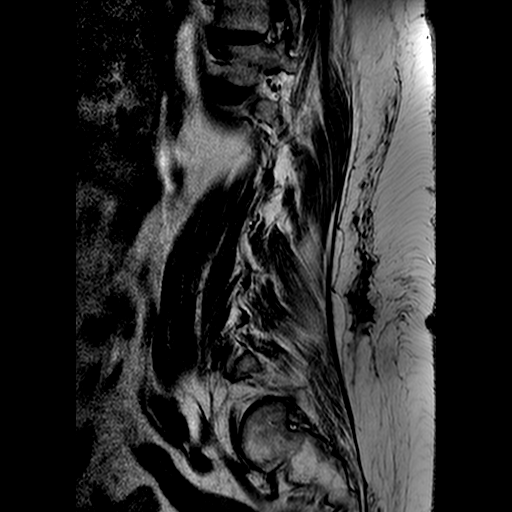

[Series 601: T2 · axial · 4.0mm · 0.47mm/px · z∈[+2,+174]mm · 11 of 30 slices shown]
[im 1/30]
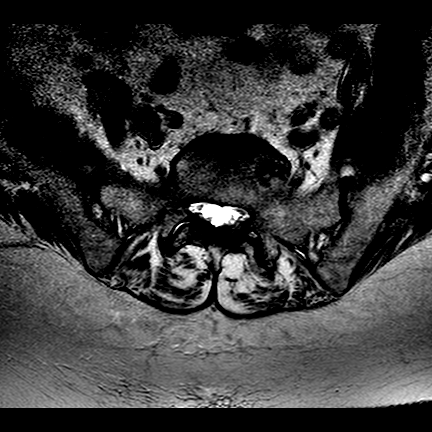
[im 3/30]
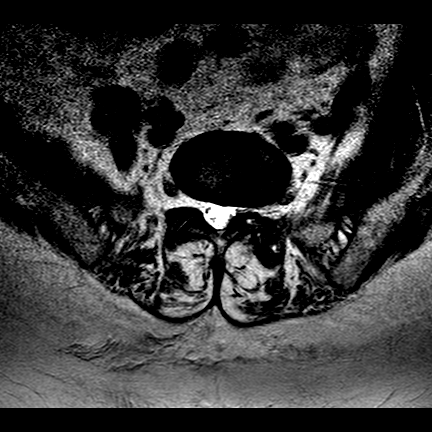
[im 6/30]
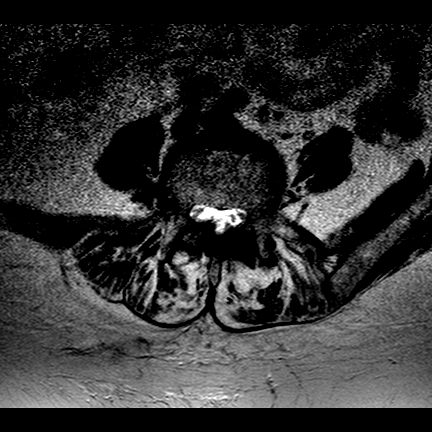
[im 9/30]
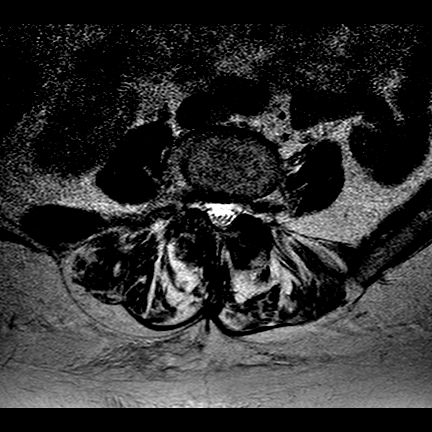
[im 12/30]
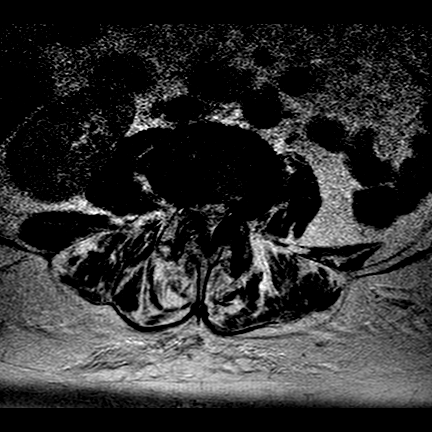
[im 15/30]
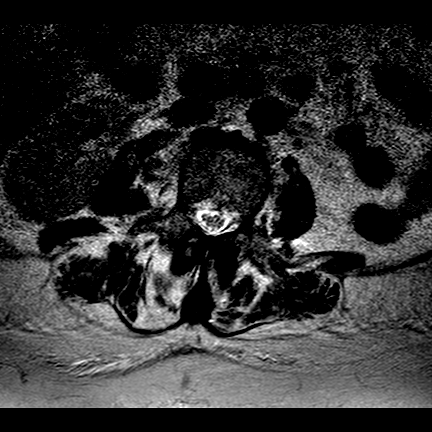
[im 18/30]
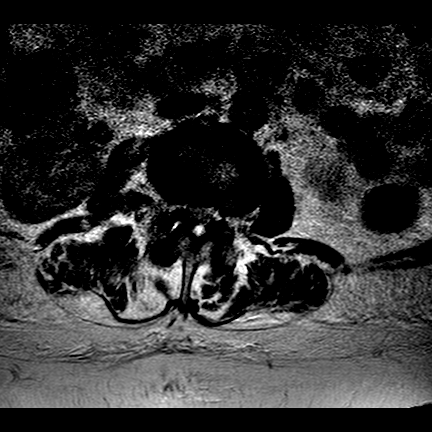
[im 21/30]
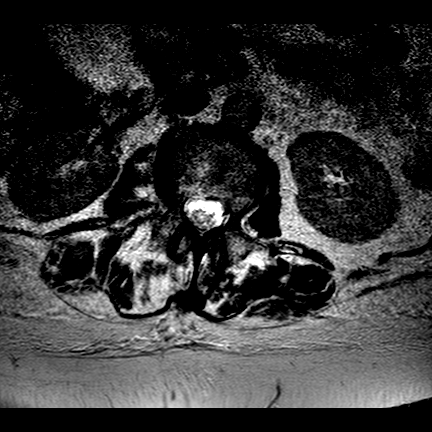
[im 24/30]
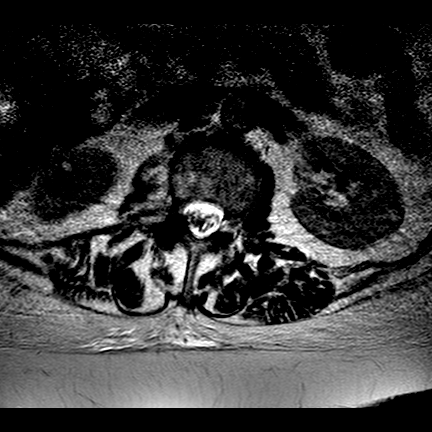
[im 27/30]
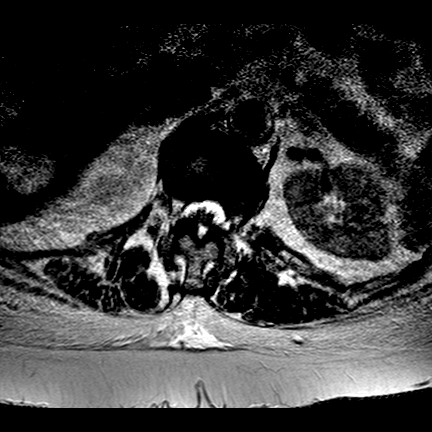
[im 30/30]
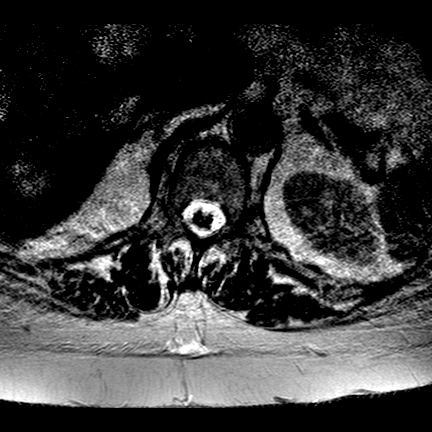

[Series 701: T1 · axial · 4.0mm · 0.35mm/px · z∈[+1,+136]mm · 6 of 35 slices shown]
[im 1/35]
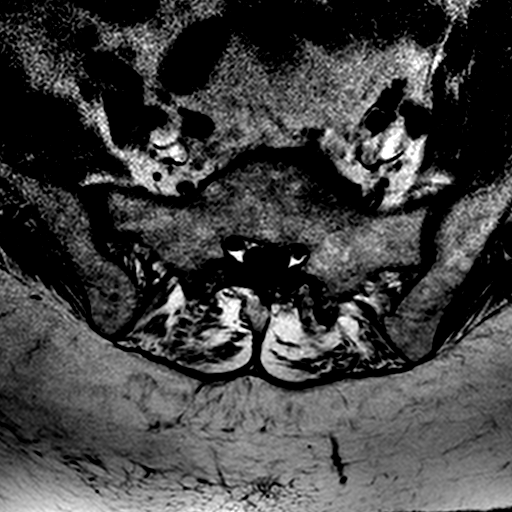
[im 6/35]
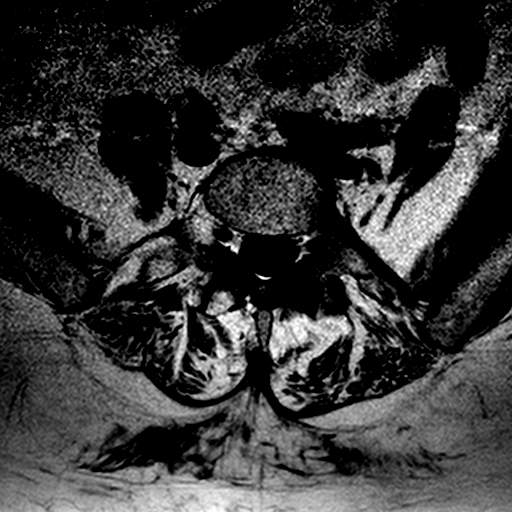
[im 12/35]
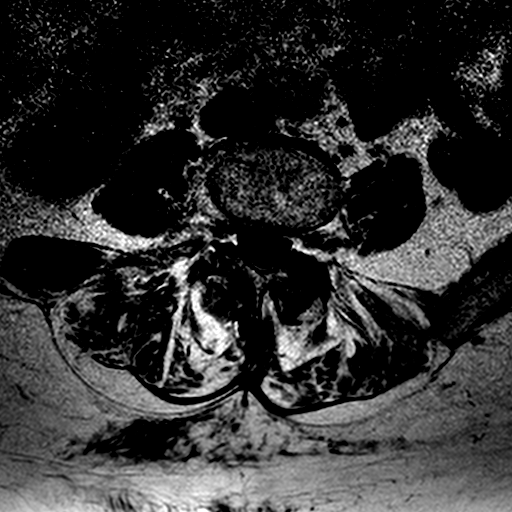
[im 15/35]
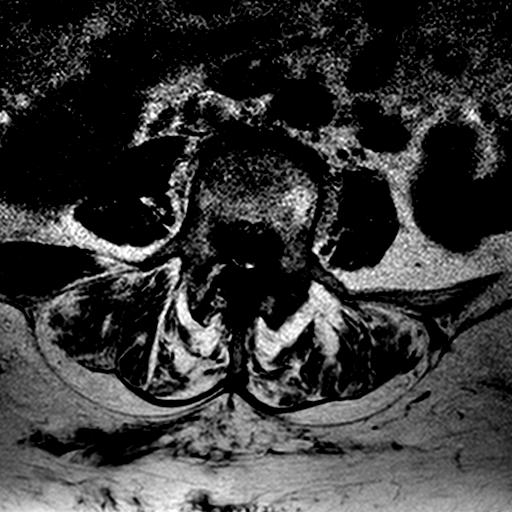
[im 18/35]
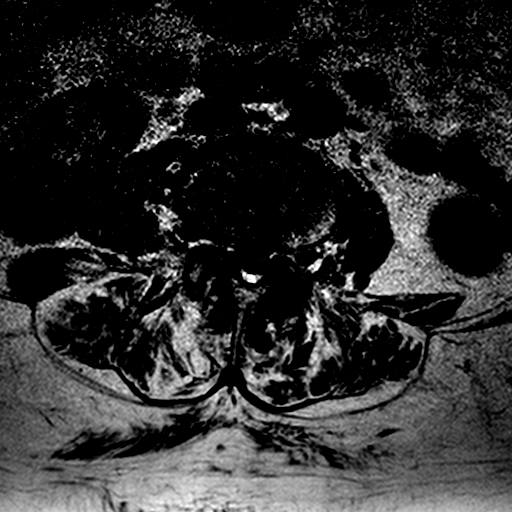
[im 29/35]
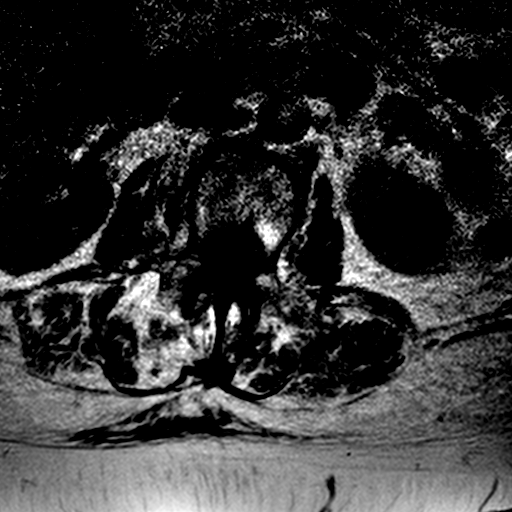

[23 of 48 positions shown; findings below may reference images not displayed]

FINDINGS: There is a subacute appearing compression fracture with loss of 
vertebral body height and edema seen at the superior endplate of the L3 
vertebral body. No other compression fractures seen in the lumbar spine. There 
is grade 1 anterolisthesis of L4 on L5. 
T12-L1: No disc protrusion, no canal stenosis and no lateral recess stenosis. 
Mild degenerative facet changes are present. 
L1-L2: Bulging disc with degenerative facet changes causing moderate right 
lateral recess stenosis and mild left lateral recess stenosis. 
L2-L3: Broad-based bulging disc and degenerative facet changes causing moderate 
canal and moderate bilateral lateral recess stenosis. 
L3-L4: Broad-based bulging disc and degenerative facet changes causing moderate 
to severe canal and bilateral lateral recess stenosis. 
L4-L5: Mild degenerative facet changes causing mild bilateral lateral recess 
stenosis without significant canal stenosis and no disc protrusion. 
L5-S1: No disc protrusion. Mild degenerative facet changes greater on left than 
right causing mild left lateral recess stenosis. No canal or right lateral 
recess stenosis.
IMPRESSION: Subacute appearing compression fracture with loss of vertebral body height at 
the superior endplate of L3. This may be amenable to vertebroplasty and would 
recommend consultation with [facility] interventional radiology for treatment. 
 L1-L2: Bulging disc with degenerative facet changes causing moderate right 
lateral recess stenosis and mild left lateral recess stenosis. 
L2-L3: Broad-based bulging disc and degenerative facet changes causing moderate 
canal and moderate bilateral lateral recess stenosis. 
L3-L4: Broad-based bulging disc and degenerative facet changes causing moderate 
to severe canal and bilateral lateral recess stenosis. 
L4-L5: Mild degenerative facet changes causing mild bilateral lateral recess 
stenosis without significant canal stenosis and no disc protrusion.

## 2021-05-06 IMAGING — MG MAMMOGRAPHY SCREENING BILATERAL 3[PERSON_NAME]
8 series · 8 of 24 positions shown · non-contrast
Comparison: Comparison was made to prior examinations.

FINAL Diagnostic Imaging Report 
________________________________________________________________________________________________ 
MAMMOGRAPHY SCREENING BILATERAL 3NAZRULPLL ESCALO, 05/06/2021 [DATE]: 
CLINICAL INDICATION: Screening.
TECHNIQUE: Digital bilateral mammograms and 3-D Tomosynthesis were obtained. 
These were interpreted both primarily and with the aid of computer-aided 
detection system.  
BREAST DENSITY: (Level A) The breasts are almost entirely fatty.

[L MLO]
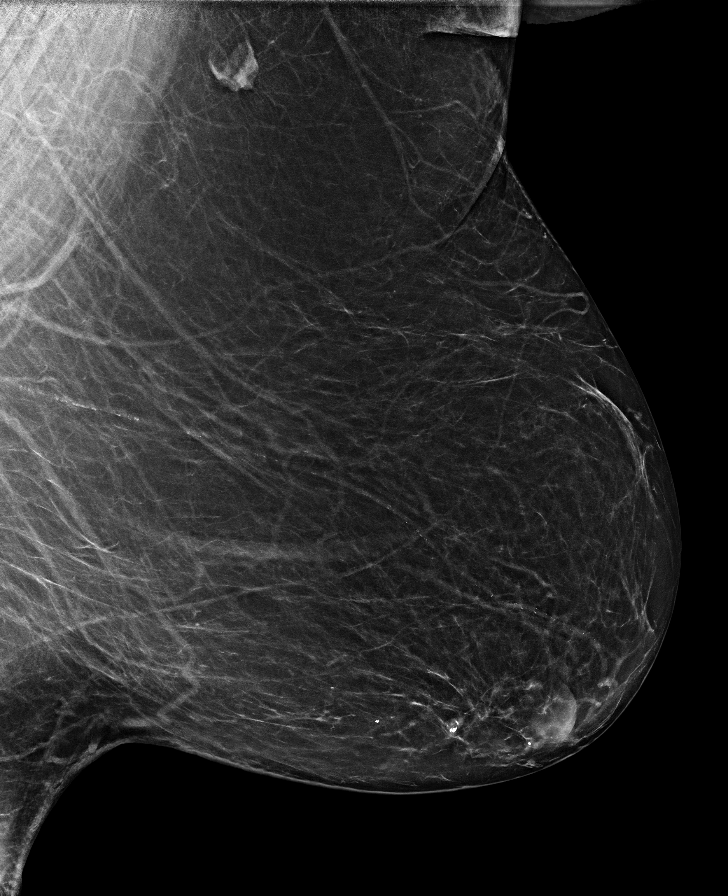

[L CC]
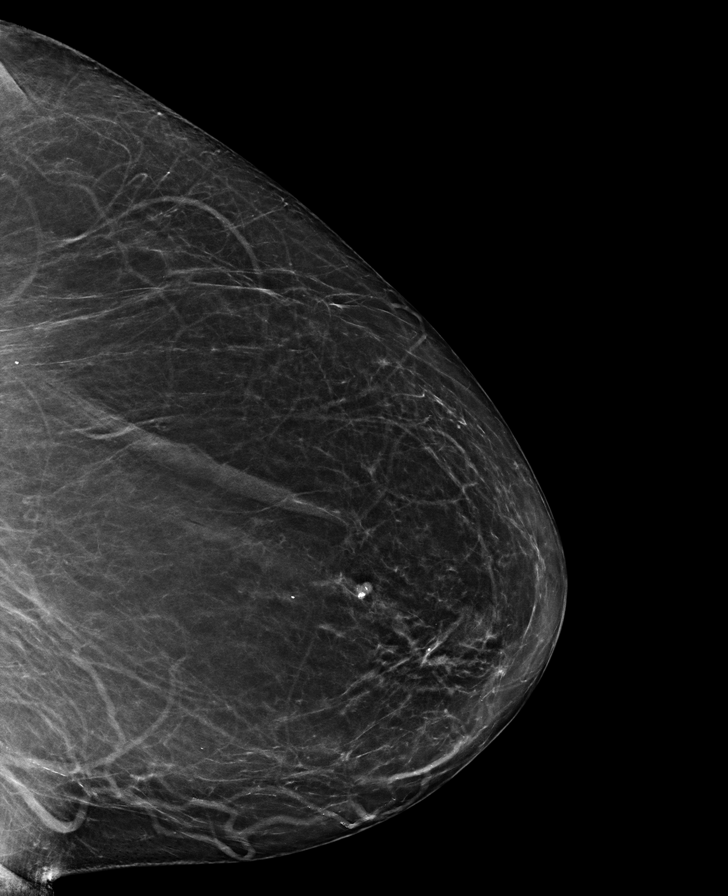

[R MLO]
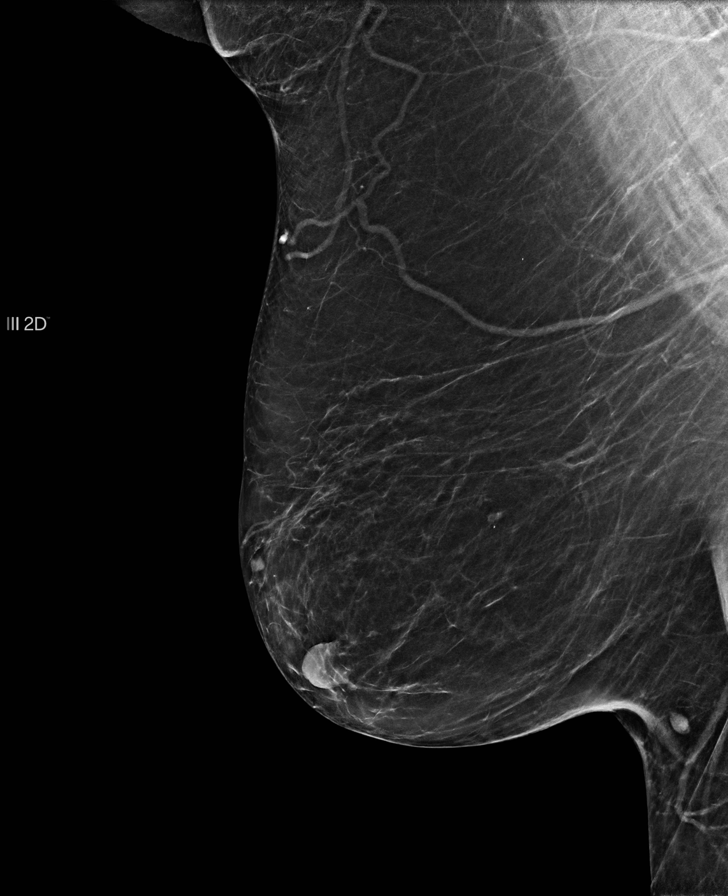

[R CC]
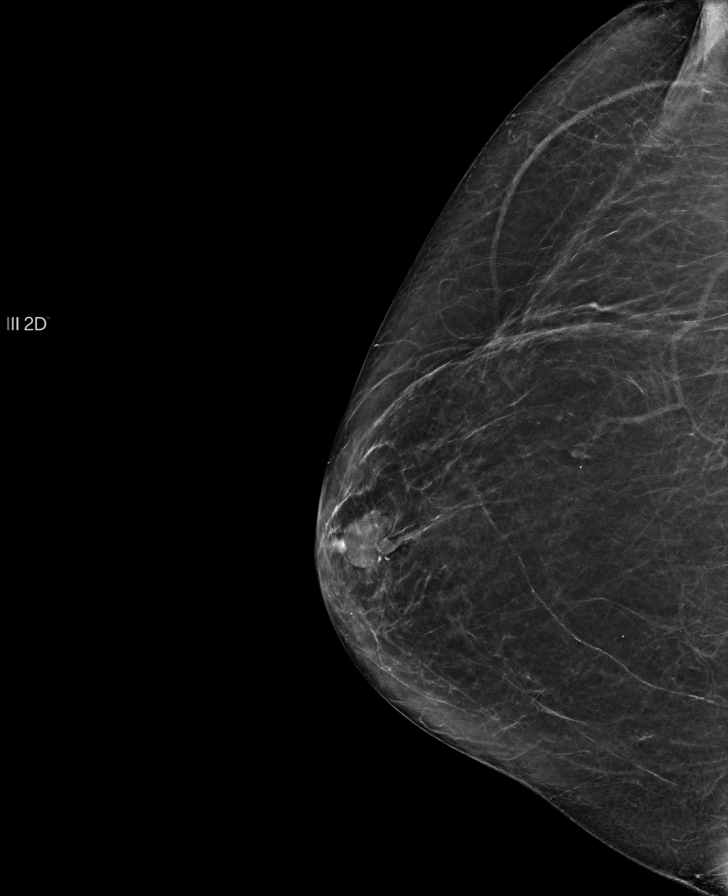

[L CC tomo · tomo slice 36/71.0]
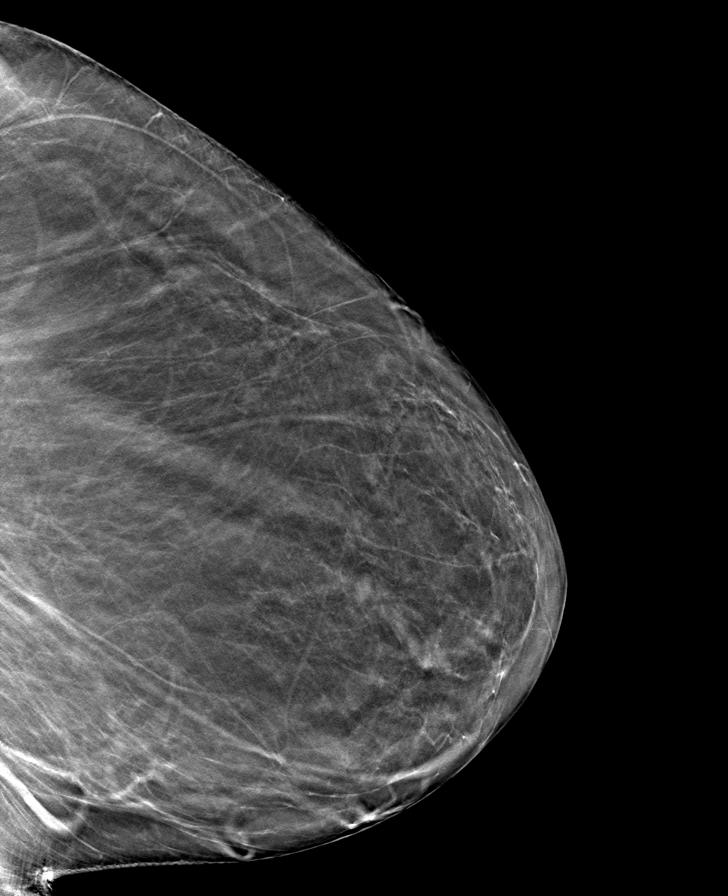

[R MLO tomo · tomo slice 32/63.0]
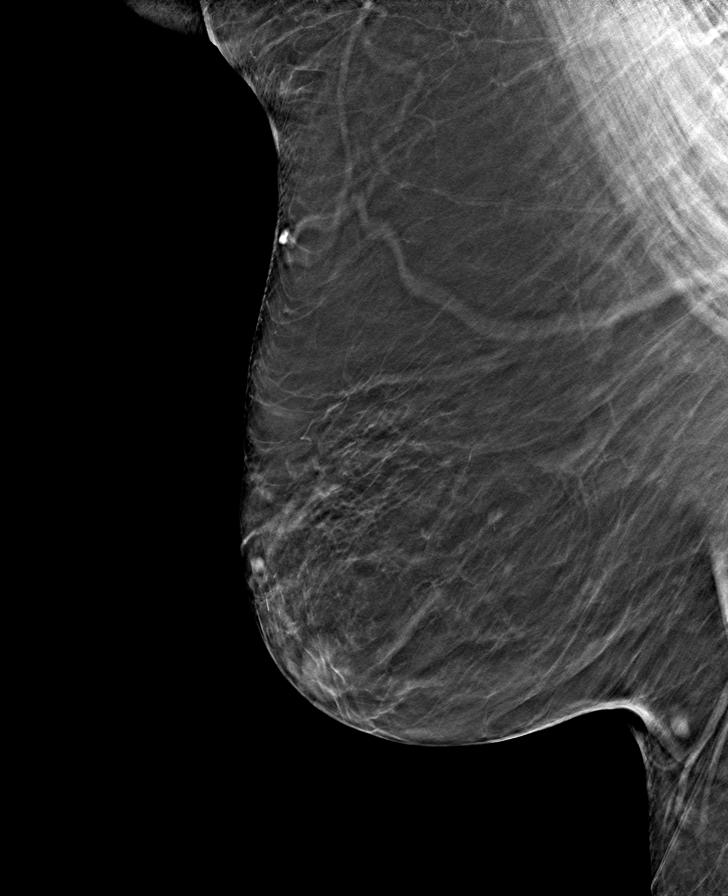

[R CC tomo · tomo slice 29/57.0]
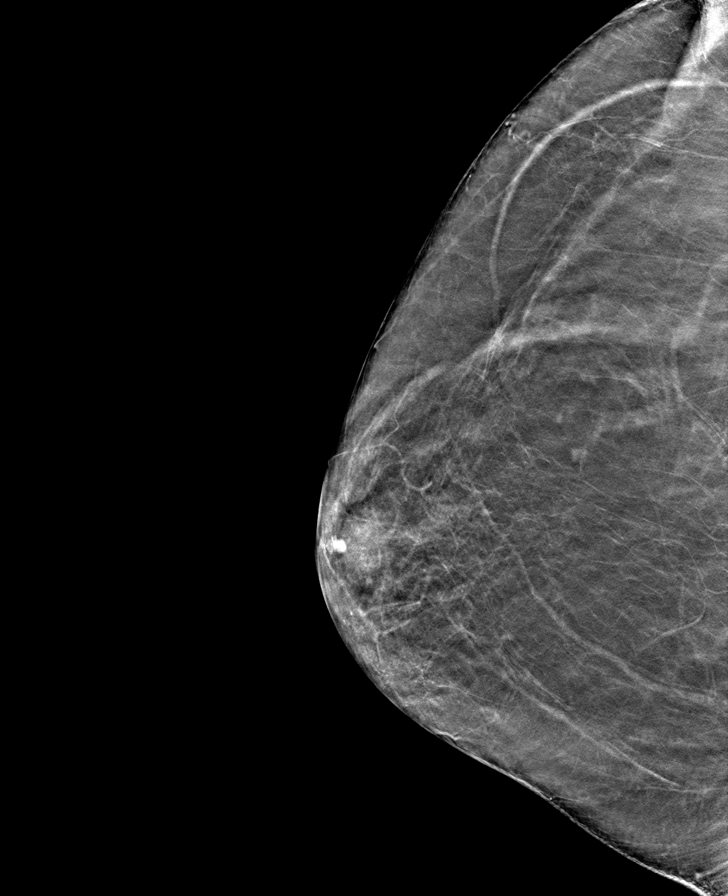

[L MLO tomo · tomo slice 37/74.0]
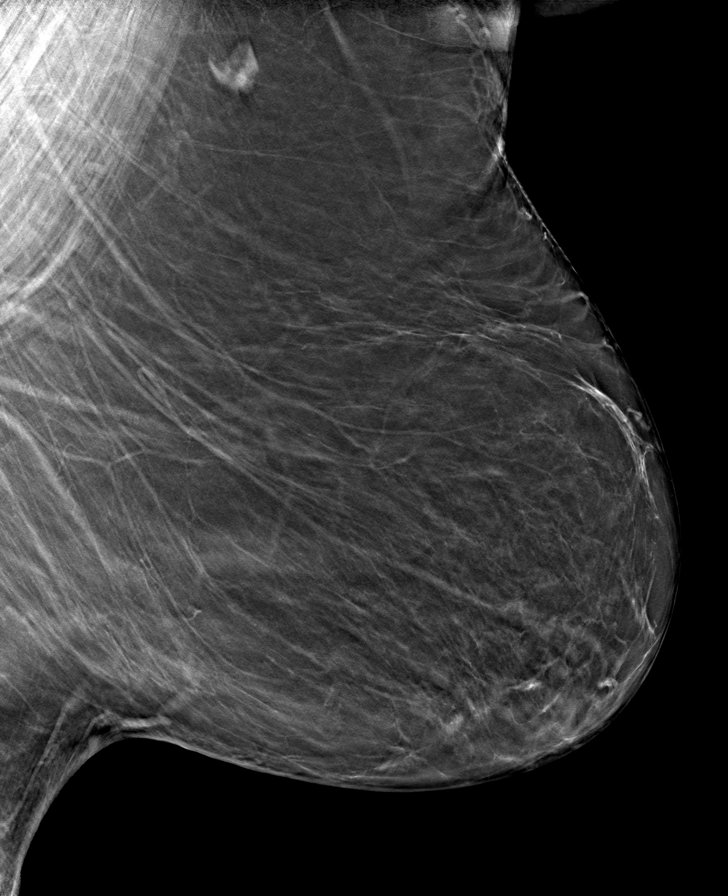

[8 of 24 positions shown; findings below may reference images not displayed]

FINDINGS: Stable nodules since 2838. No new suspicious abnormality seen. Stable 
asymmetric size of the breasts.
IMPRESSION: No mammographic evidence of malignancy in either breast. 
(BI-RADS 2) Benign findings. Routine mammographic follow-up is recommended.

## 2022-01-27 IMAGING — MR MRI LUMBAR SPINE WITHOUT CONTRAST
4 of 8 series · 8 of 48 positions shown · IV contrast (gadolinium)
Comparison: Vertebroplasty procedure May 27, 2020. MRI lumbar spine May 19, 2020.

________________________________________________________________________________________________ 
MRI LUMBAR SPINE WITHOUT CONTRAST, 01/27/2022 [DATE]: 
CLINICAL INDICATION: 70-year-old female with low back pain. History of 
vertebroplasty. No recent fall or trauma.
TECHNIQUE: Multiplanar, multiecho position MR images of the lumbar spine were 
performed without intravenous gadolinium enhancement. Patient was scanned on a 
1.5T magnet.

[Series 101: survey · axial · 10.0mm · 1.25mm/px · 1 of 10 slices shown]
[im 1/10]
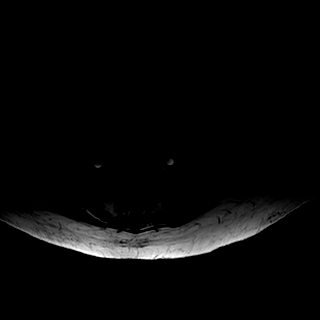

[Series 201: t2w_cor-surv · coronal · 6.0mm · 0.62mm/px · 1 of 10 slices shown]
[im 1/10]
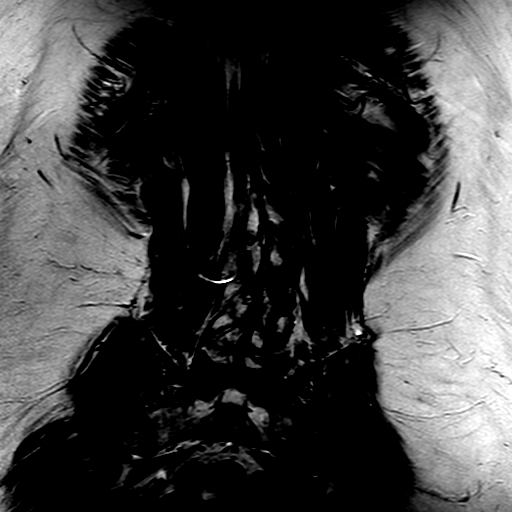

[Series 301: t1_tse_sag · sagittal · 4.0mm · 0.47mm/px · 3 of 19 slices shown]
[im 1/19]
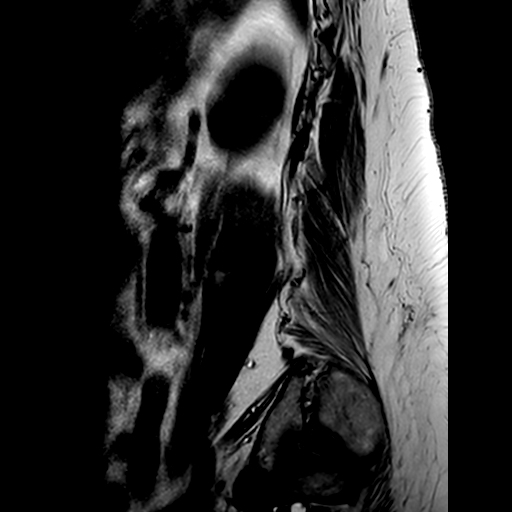
[im 10/19]
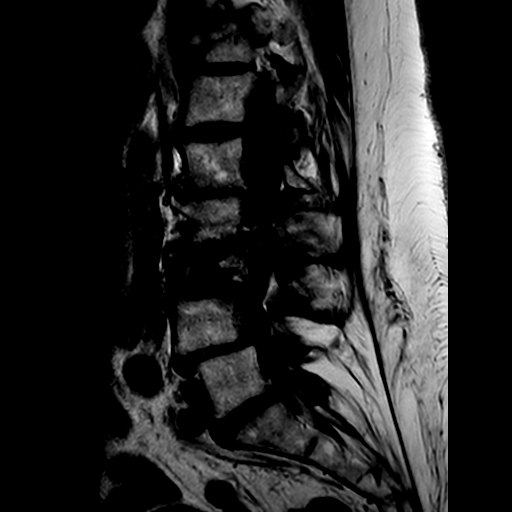
[im 19/19]
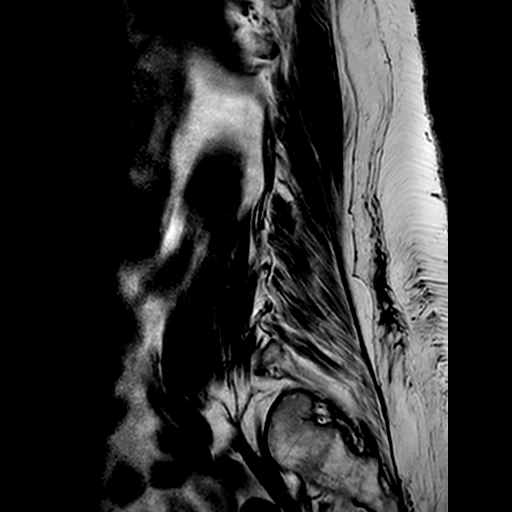

[Series 402: (id) view_ax mpr · axial · 1.0mm · 0.25mm/px · z∈[+7,+187]mm · 3 of 111 slices shown]
[im 15/111]
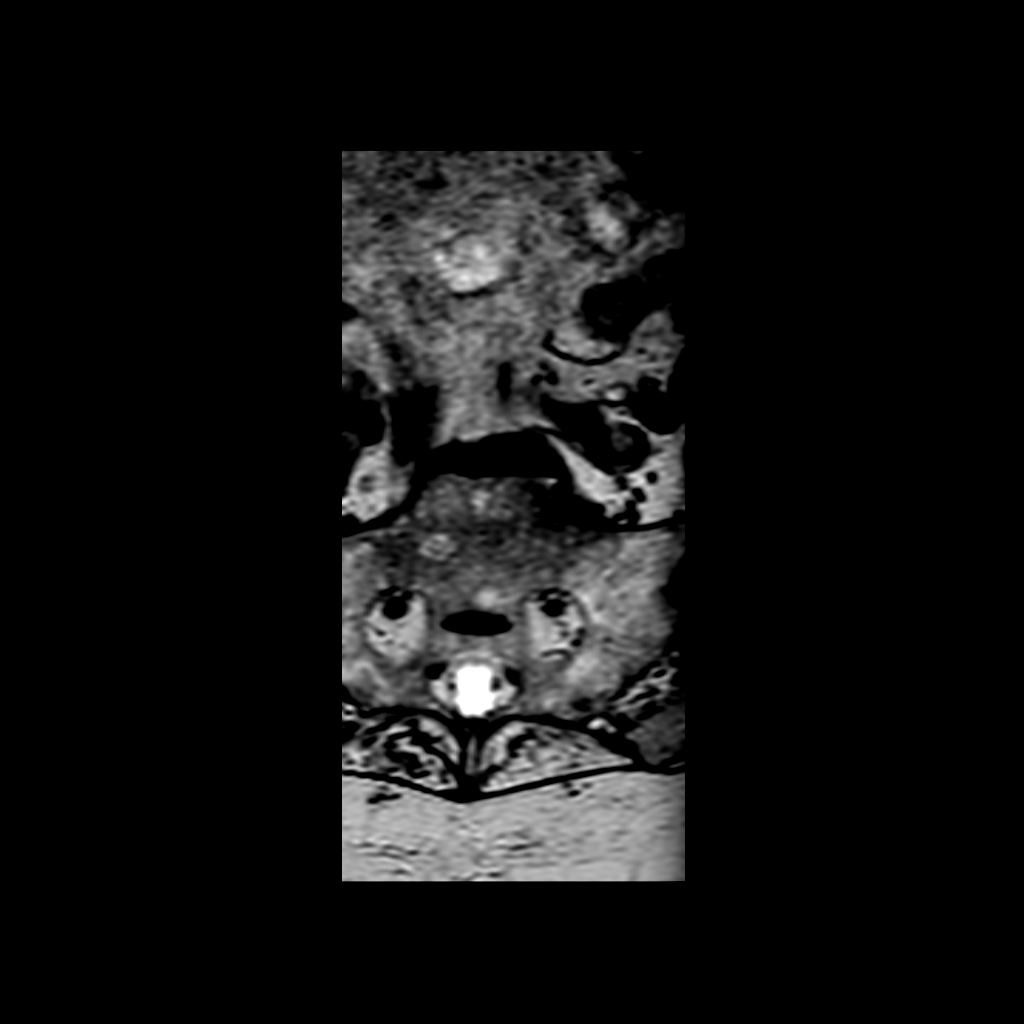
[im 59/111]
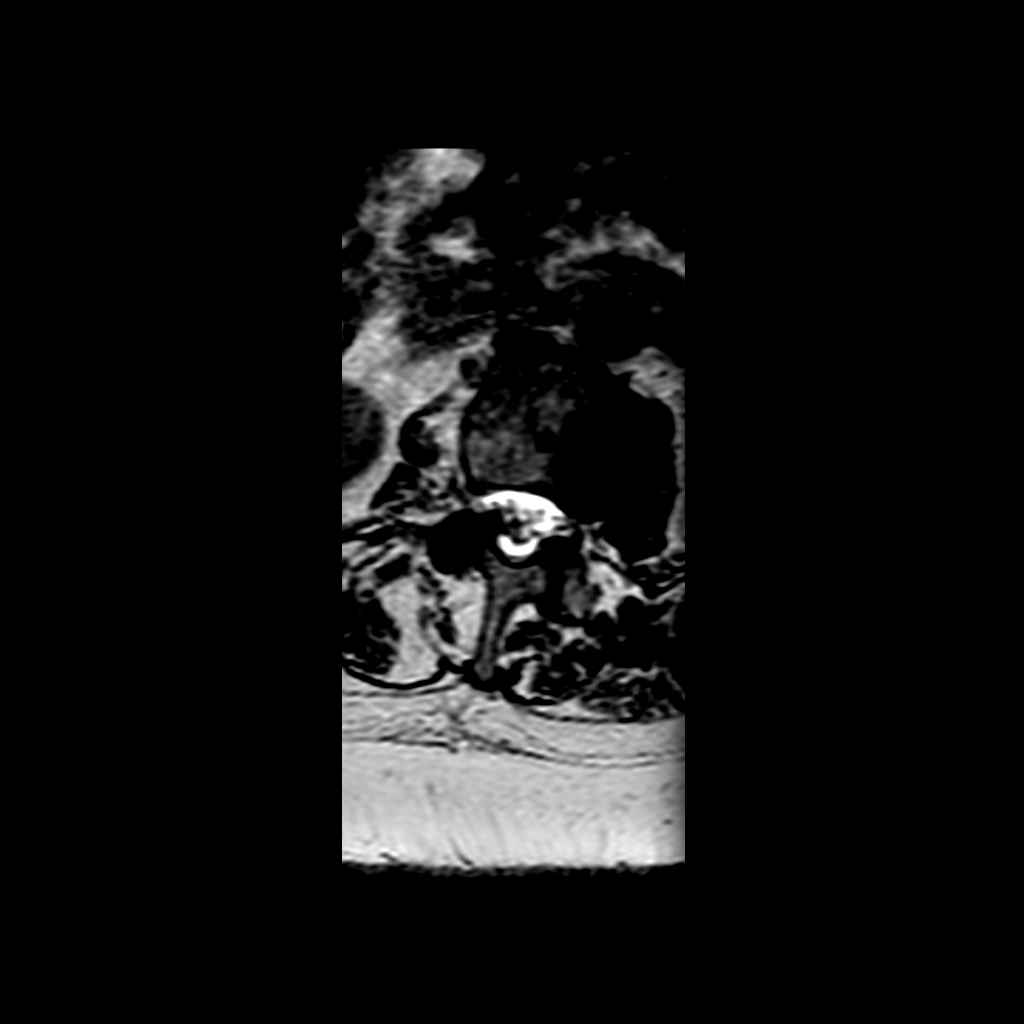
[im 96/111]
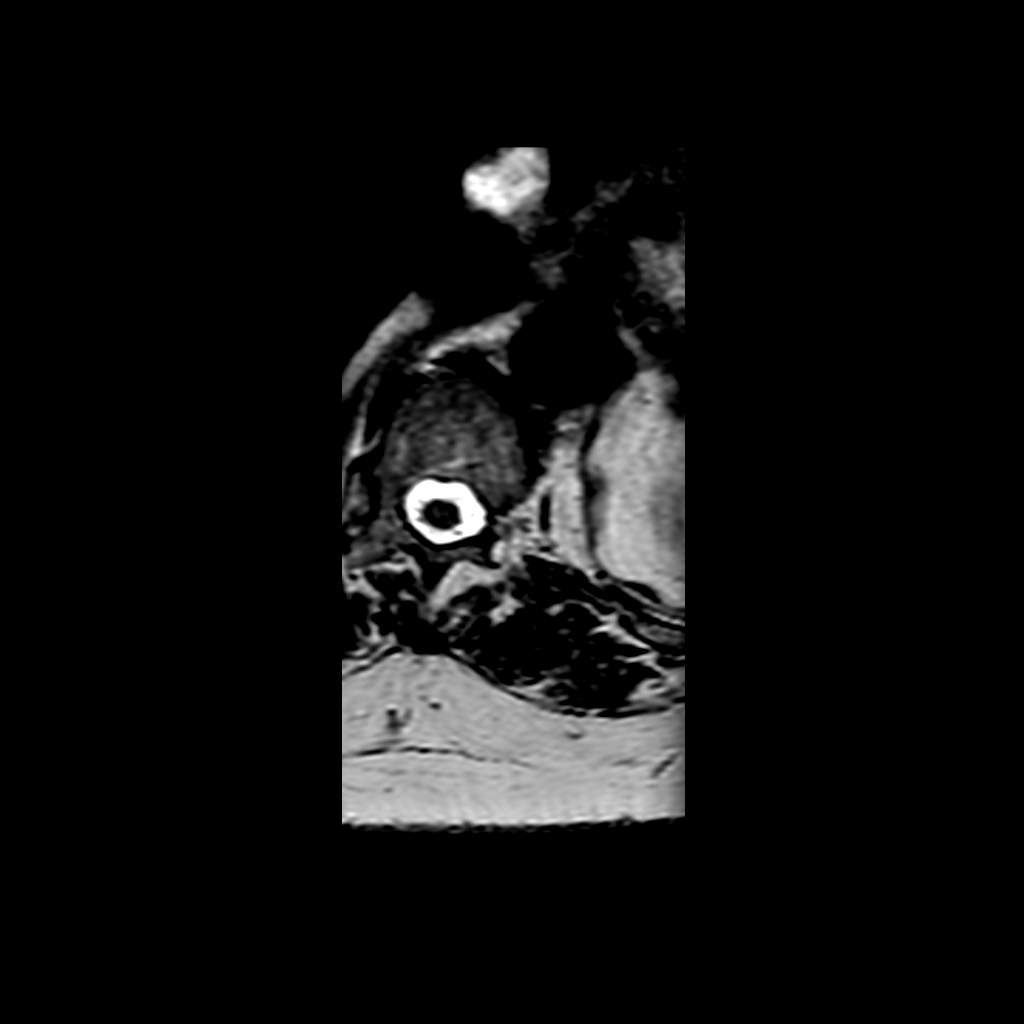

[8 of 48 positions shown; findings below may reference images not displayed]

FINDINGS: -------------------------------------------------------------------------------- 
------ 
GENERAL: 
5 lumbar type vertebral bodies. Moderate levoconvex lumbar scoliosis apex to the 
left at the L2 level. 5 mm left lateral translation of L3 on L4. Findings are 
stable. Loss of the normal lumbar lordosis with 4 mm retrolisthesis L3 on L4 and 
4 mm anterolisthesis L4 on L5. No pars defect. Findings are degenerative in 
etiology. L3 compression fracture deformity previously treated by 
vertebroplasty. The degree of compression of the L3 vertebral body has 
progressed since May 19, 2020. Currently there is 61% loss of vertebral body 
height. No osseous retropulsion. Other vertebral bodies are preserved in height 
without evidence of acute or subacute compression fracture deformity. L4 
hemangioma. Otherwise there is normal marrow signal for age. No evidence of 
active facet arthritis. No ligamentous edema. Conus ends at L1-L2 level. Distal 
cord unremarkable. The cauda equina is unremarkable other than compression, 
which will be detailed below. Hepatic cysts noted. Right hip arthroplasty. No 
other paraspinous abnormality.     
Modic I-II: L1-L2, L3-L4. 
Ligamentum Flavum > 2.5 mm: All levels. 
-------------------------------------------------------------------------------- 
------ 
SEGMENTAL: 
T11-T12: Loss of disc signal with mild to moderate loss of disc height. Minimal 
annular bulge. Canal and foramina are patent. Mild facet arthropathy. Findings 
stable. 
T12-L1: Slight loss of disc signal. Mild annular bulge with mild canal stenosis. 
Facet arthropathy with posterior ligamentous hypertrophy. Foramina patent. Mild 
facet arthropathy. Findings stable. 
L1-L2: Severe loss of disc height with loss of disc signal. Marginal 
osteophytes. Mild annular bulge with mild canal stenosis. Facet arthropathy with 
posterior ligamentous hypertrophy. Left foramen patent. Mild right foraminal 
narrowing. Finding stable. 
L2-L3: Severe loss of disc height has progressed. Loss of disc signal. Moderate 
canal stenosis with a degree of compression of the cauda equina nerve roots. 
Posterior ligamentous hypertrophy. Slight narrowing right lateral recess. 
Moderate to severe right foraminal narrowing. Patent left foramen. The degree of 
right foraminal narrowing has also progressed. 
L3-L4: Moderate loss of disc height with loss of disc signal. Loss of disc 
height has progressed. Moderate canal stenosis with a degree of compression of 
the cauda equina nerve roots. Facet arthropathy with posterior ligamentous 
hypertrophy. Lateral recess narrowing bilaterally, but worse on the left. Left 
foramen is moderately narrowed. Right foramen is mildly narrowed. Degree of 
canal and foraminal narrowing has slightly progressed. 
L4-L5: Moderate loss of disc height. Loss of disc signal. Anterolisthesis with 
disc uncovering. Mild canal narrowing with specific narrowing left lateral 
recess. Facet arthropathy bilaterally. Left foraminal disc extrusion with 
moderate left foraminal narrowing. Moderate right foraminal narrowing is also 
present. Findings stable. 
L5-S1: Loss of disc signal. Minimal annular bulge. Slight narrowing left lateral 
recess. Canal otherwise patent. Patent right foramen. Mild to moderate left 
foraminal narrowing with evidence of left foraminal disc protrusion. Facet 
arthropathy. Findings stable. 
-------------------------------------------------------------------------------- 
------
IMPRESSION: Progressive lumbar degenerative and scoliotic changes as detailed above. 
Progression of an L3 compression fracture deformity previously treated by 
vertebroplasty. No osseous retropulsion. 
L2-L3, moderate canal stenosis and moderate to severe right foraminal narrowing 
has progressed. 
L3-L4, moderate canal stenosis with a degree of compression of the cauda equina 
nerve roots, moderate left foraminal narrowing and mild right foraminal 
narrowing. Findings progressed. 
Other stable appearing multilevel degenerative changes detailed above.

## 2023-04-11 IMAGING — MG MAMMOGRAPHY SCREENING BILATERAL 3[PERSON_NAME]
8 series · 8 of 24 positions shown · non-contrast
Comparison: Comparison was made to prior examinations.

________________________________________________________________________________________________ 
MAMMOGRAPHY SCREENING BILATERAL 3DACIA JONG, 04/11/2023 [DATE]: 
CLINICAL INDICATION: Encounter for screening mammogram.
TECHNIQUE: Digital bilateral mammograms and 3-D Tomosynthesis were obtained. 
These were interpreted both primarily and with the aid of computer-aided 
detection system.  
BREAST DENSITY: (Level A) The breasts are almost entirely fatty.

[R MLO]
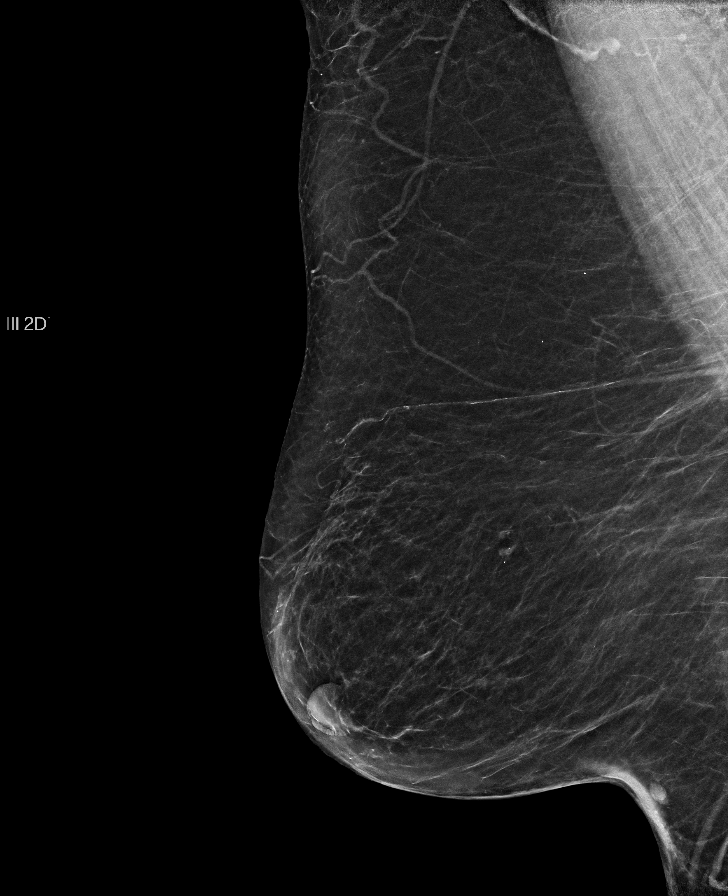

[L CC]
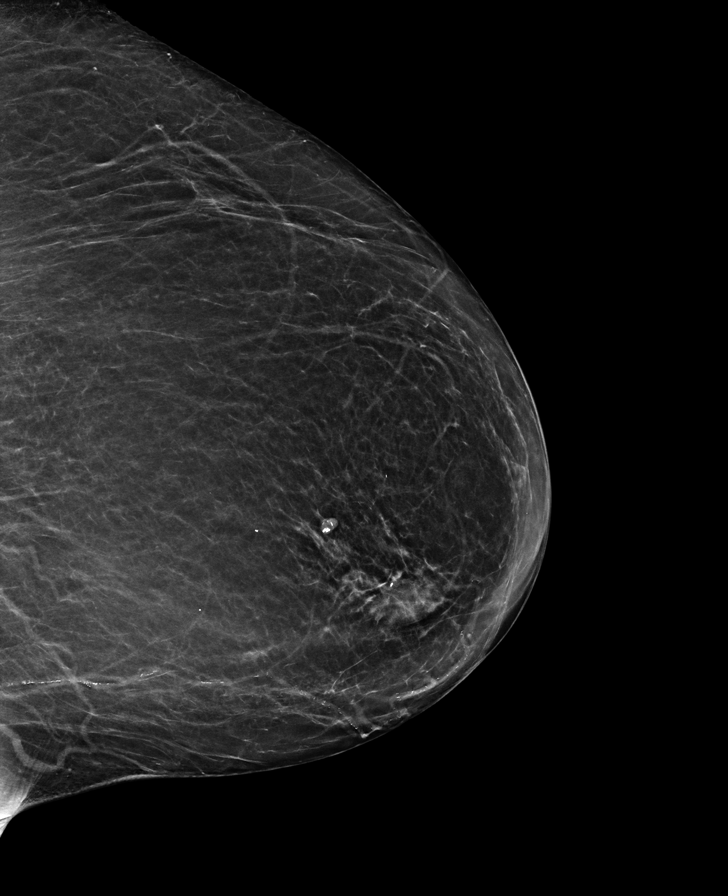

[L MLO]
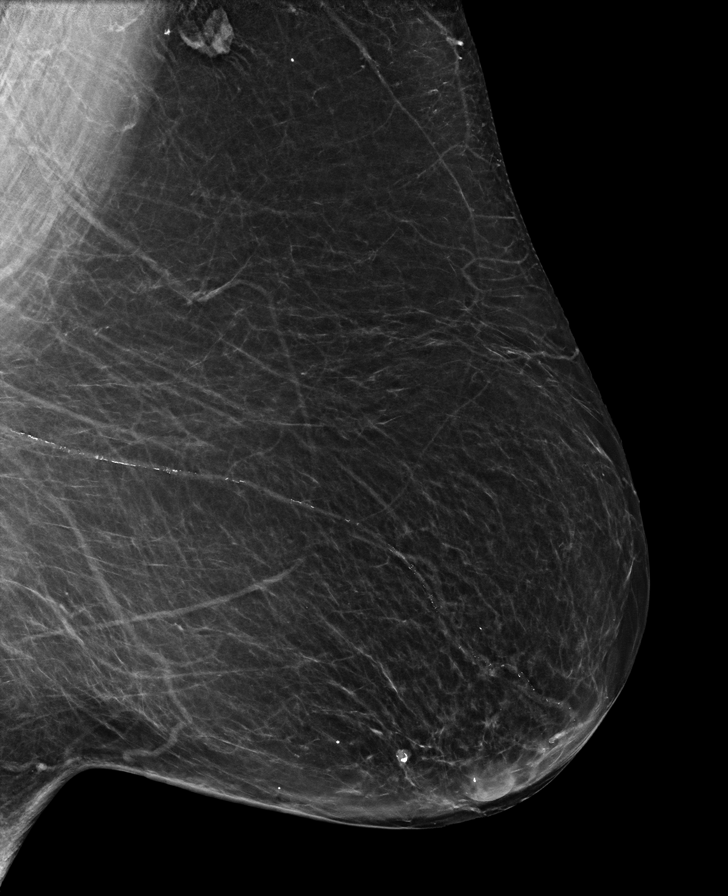

[R CC]
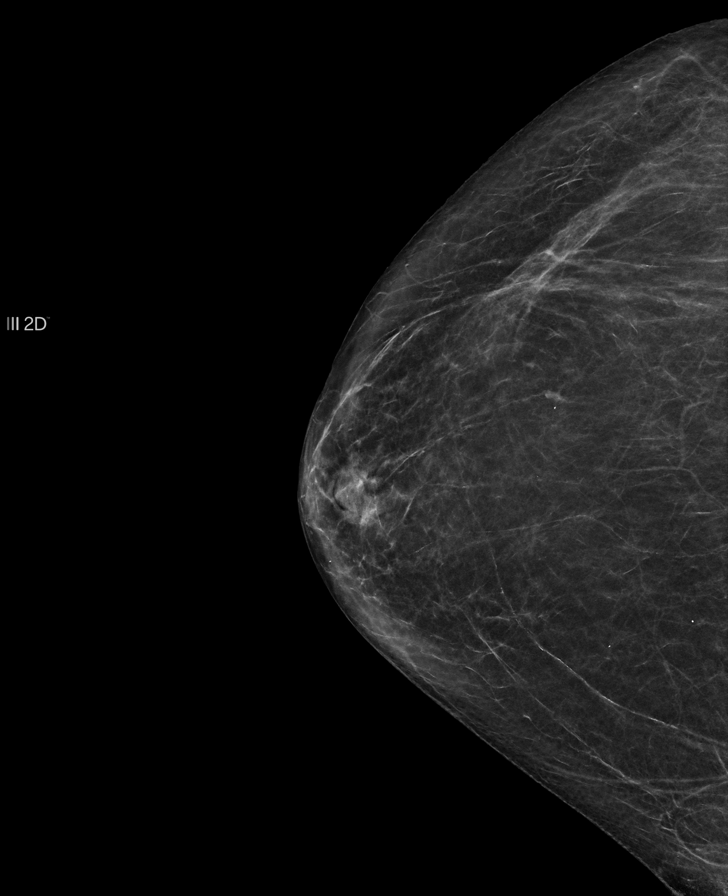

[L CC tomo · tomo slice 31/61.0]
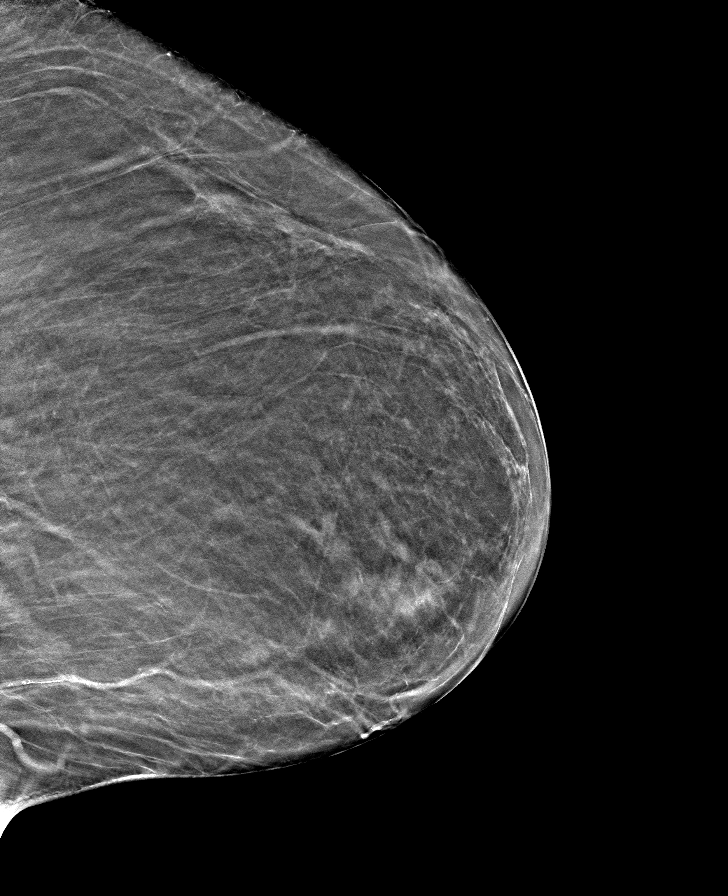

[R CC tomo · tomo slice 27/52.0]
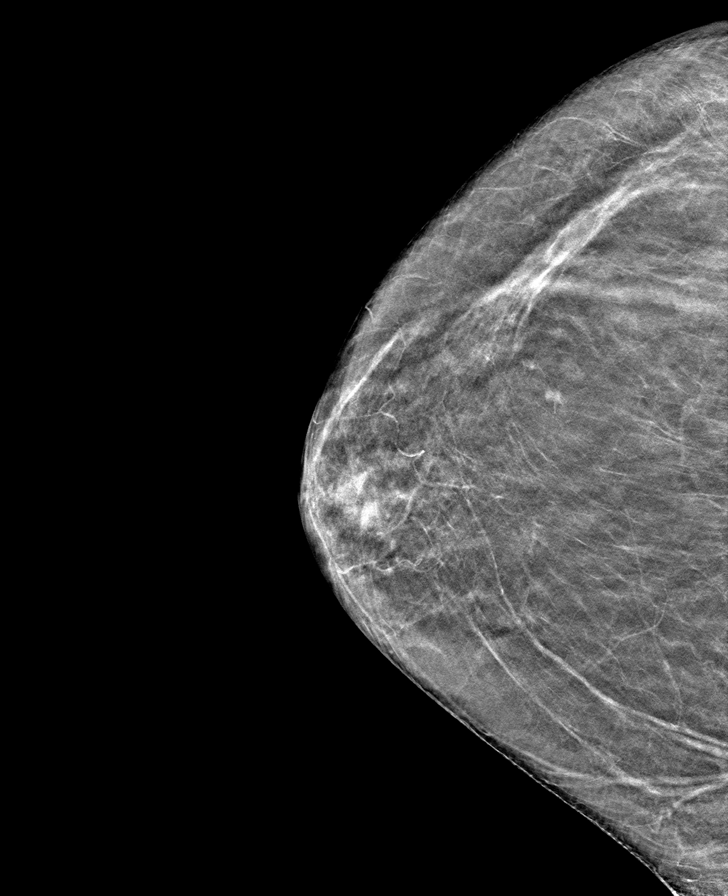

[L MLO tomo · tomo slice 31/61.0]
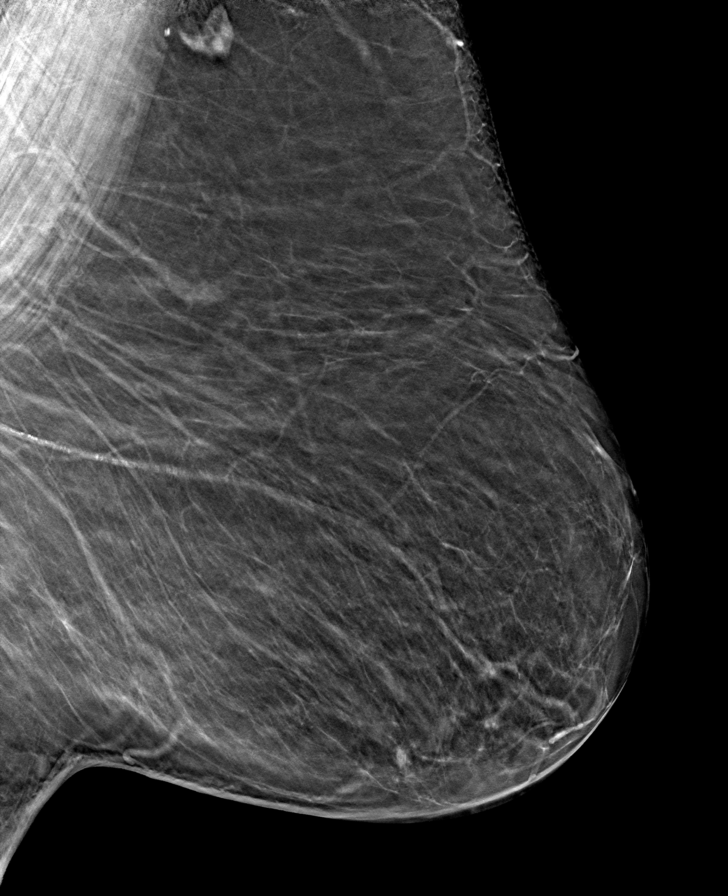

[R MLO tomo · tomo slice 27/52.0]
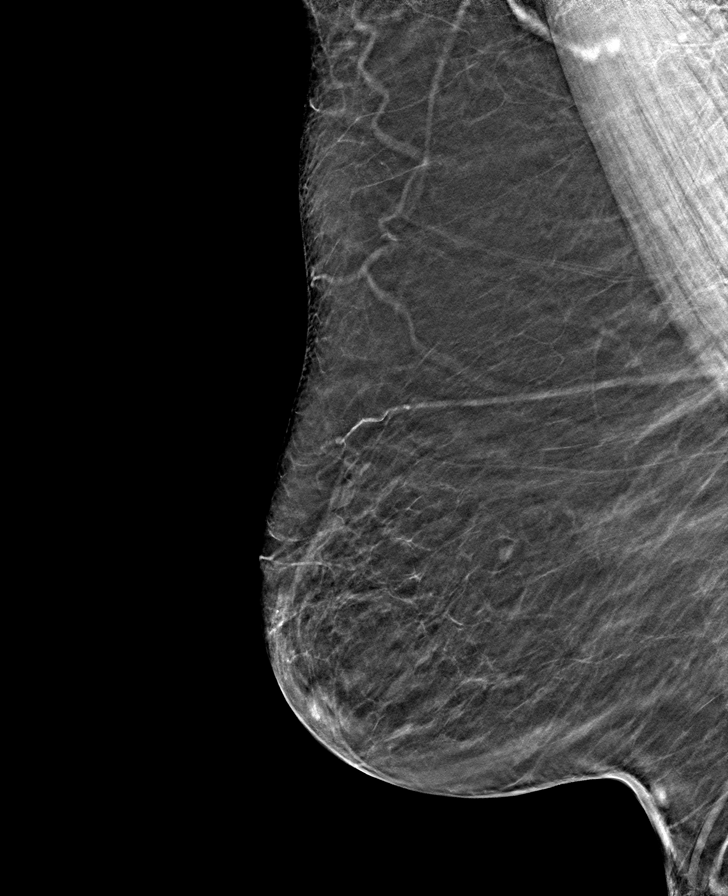

[8 of 24 positions shown; findings below may reference images not displayed]

FINDINGS: Benign calcification. No suspicious mass, calcifications, or area of 
architectural distortion in either breast.
IMPRESSION: Stable mammogram. 
(BI-RADS 2) Benign findings. Routine mammographic follow-up is recommended.

## 2023-06-20 IMAGING — DX LUMBAR SPINE AP, LAT WITH FLEXION AND EXTEN
1 series · 4 of 4 positions shown · non-contrast
Comparison: MRI lumbar spine January 27, 2022.

________________________________________________________________________________________________ 
LUMBAR SPINE AP, LAT WITH FLEXION AND EXTEN, 06/20/2023 [DATE]: 
CLINICAL INDICATION: Chronic low back pain. Previous vertebroplasty.

[Series 1: AP · 0.14mm/px · 4 of 4 slices shown]
[im 1/4]
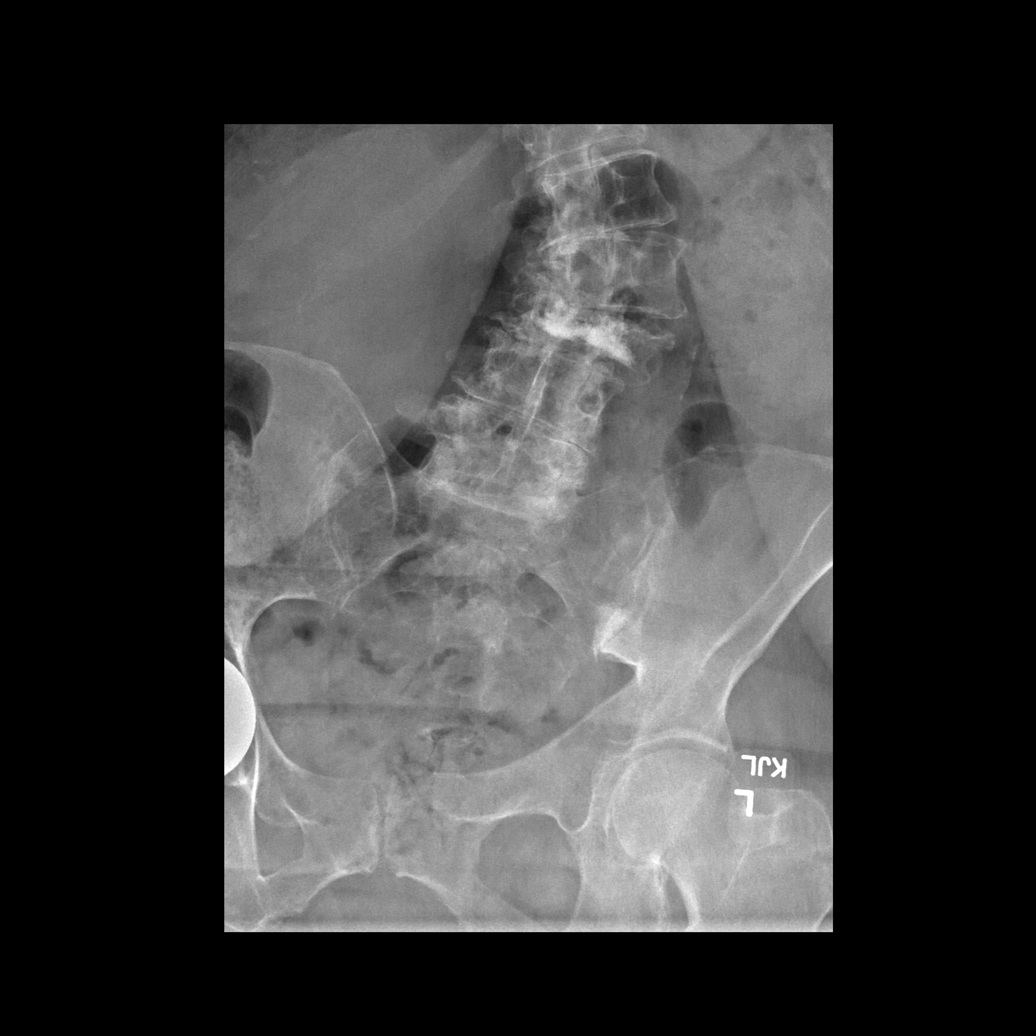
[im 2/4]
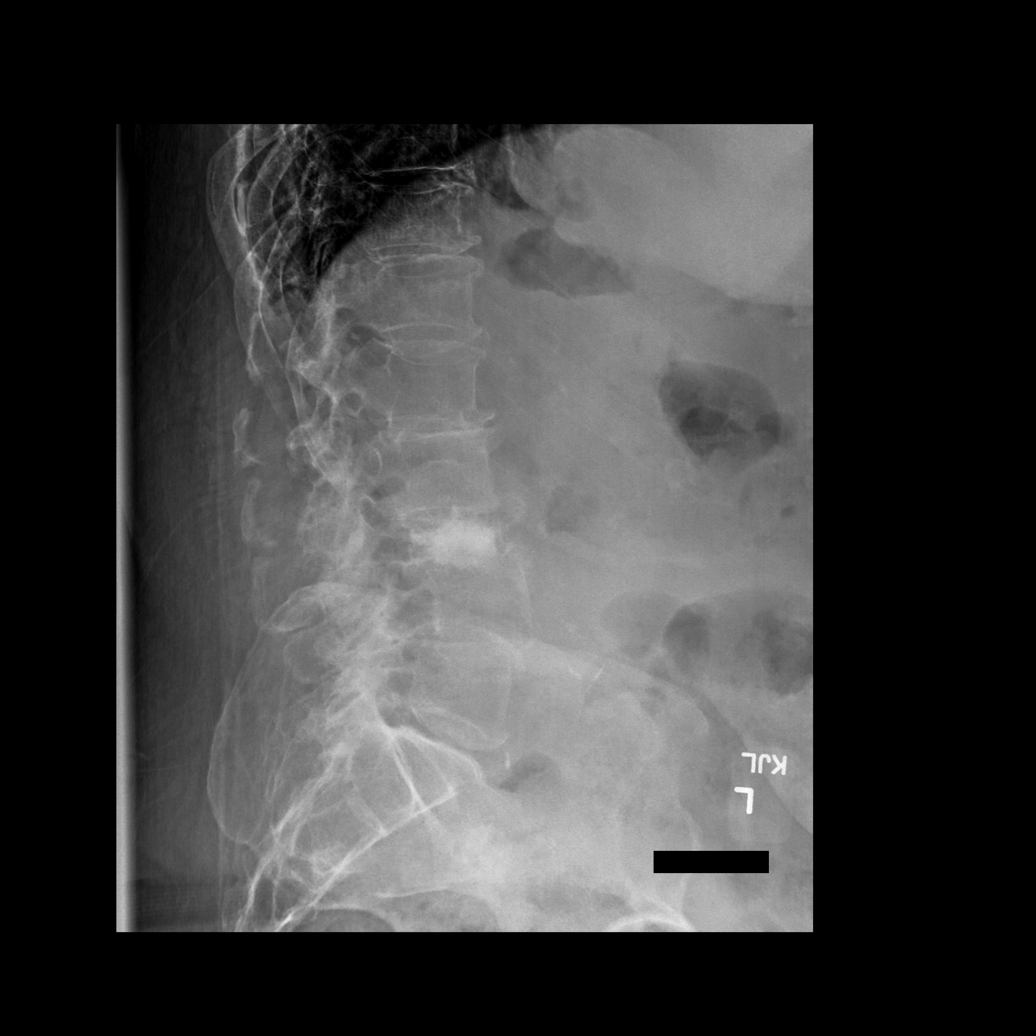
[im 3/4]
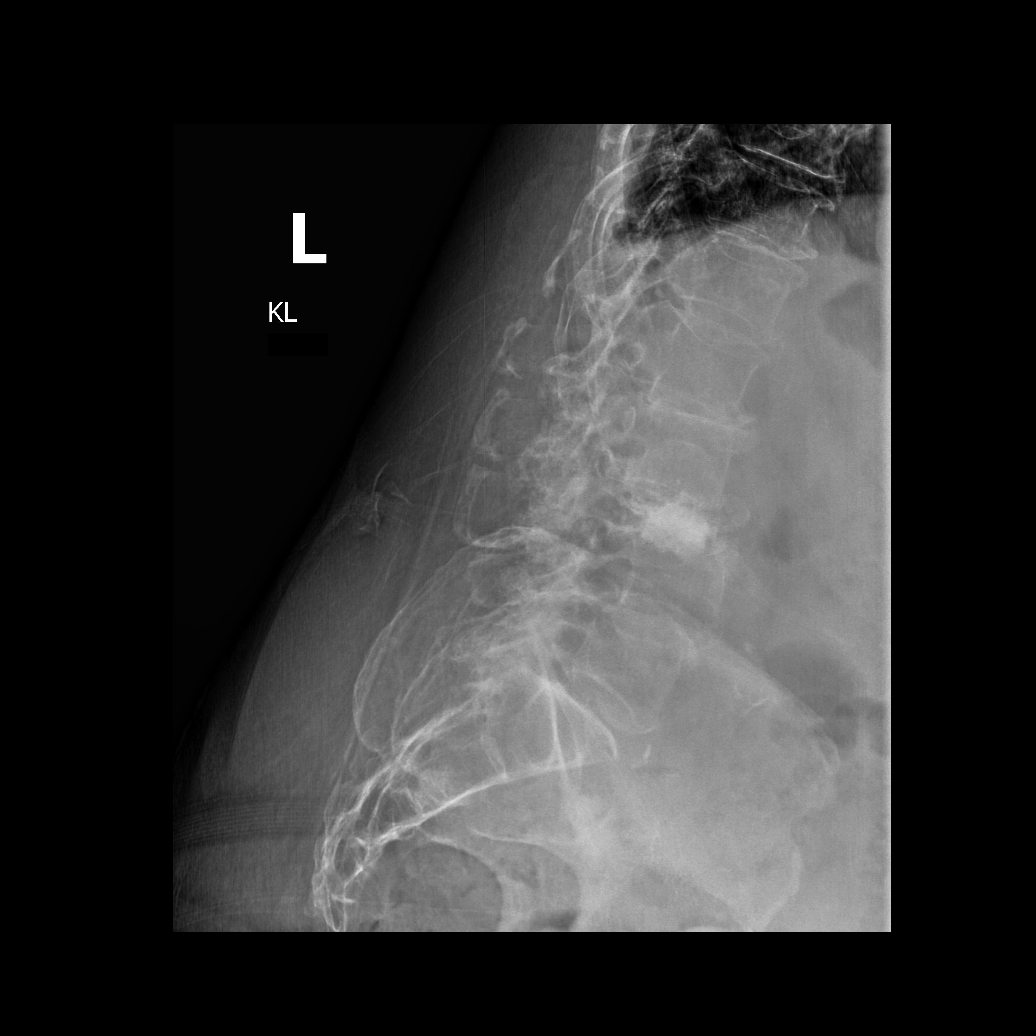
[im 4/4]
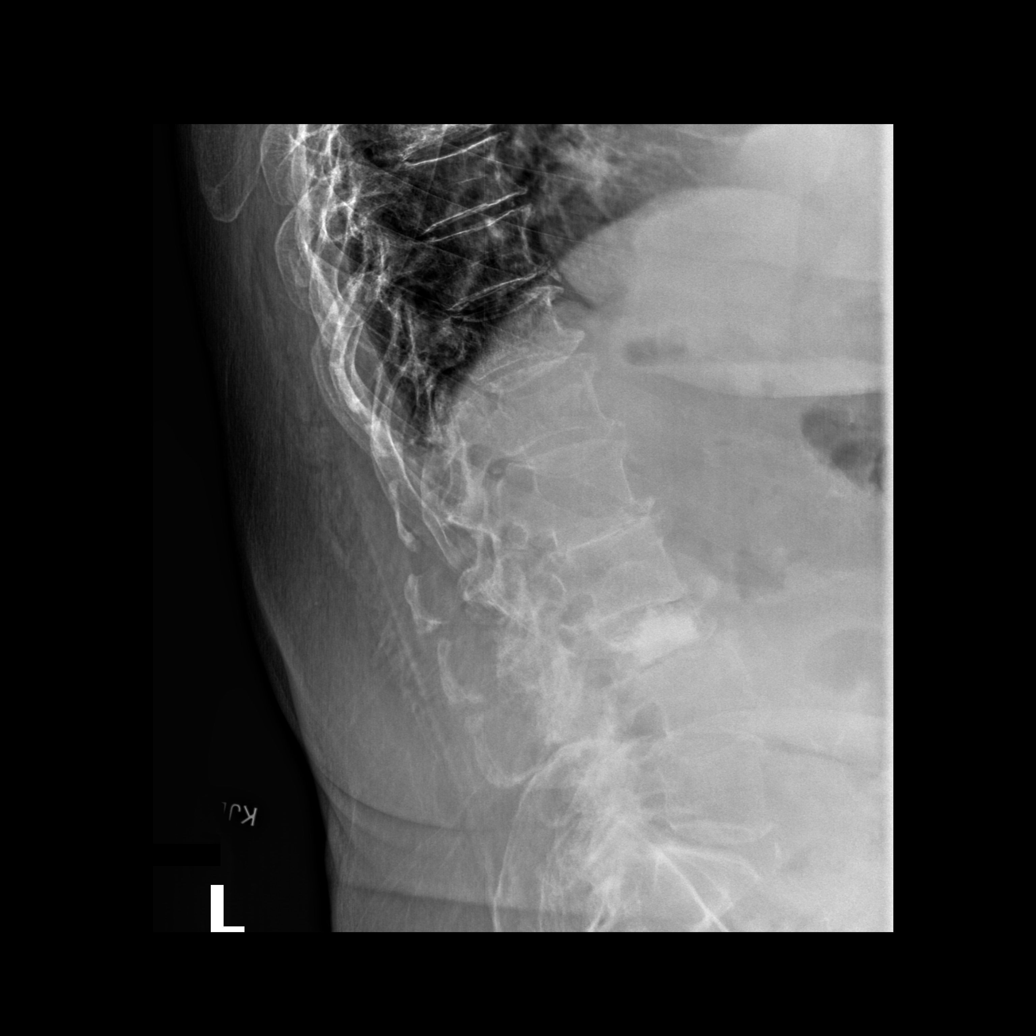

[4 of 4 positions shown; findings below may reference images not displayed]

FINDINGS: 5 lumbar type vertebral bodies. Progressive moderate to moderately 
severe levoconvex lumbar scoliosis. Osteopenia. L3 compression deformity 
previously treated by vertebroplasty. Difficult to evaluate for progressive 
height loss given osteopenia and scoliotic changes. Other vertebral bodies are 
preserved in height. Grade 1 anterolisthesis L4 on L5 without dynamic 
instability. Stable to previous MRI study. Multilevel loss of disc height. 
Lumbar facet arthropathy. Right hip arthroplasty. Incompletely visualized.
IMPRESSION: Progressive levoconvex thoracic lumbar scoliosis. 
L3 compression deformity previously treated by vertebroplasty. Difficult to 
evaluate for progressive height loss given the degree of osteopenia and 
scoliotic change. Consider repeat MRI to further assess. 
Extensive osteopenia. 
Stable grade 1 anterolisthesis L4 on L5 without dynamic instability. 
Other degenerative changes.
